# Patient Record
Sex: Female | Born: 1970 | Race: White | Hispanic: No | Smoking: Former smoker
Health system: Southern US, Community
[De-identification: ages and names within clinical notes are randomized; demographics above are authoritative.]

## PROBLEM LIST (undated history)

## (undated) DIAGNOSIS — G4730 Sleep apnea, unspecified: Secondary | ICD-10-CM

## (undated) DIAGNOSIS — E079 Disorder of thyroid, unspecified: Secondary | ICD-10-CM

## (undated) DIAGNOSIS — K5792 Diverticulitis of intestine, part unspecified, without perforation or abscess without bleeding: Secondary | ICD-10-CM

## (undated) DIAGNOSIS — E119 Type 2 diabetes mellitus without complications: Secondary | ICD-10-CM

## (undated) DIAGNOSIS — B019 Varicella without complication: Secondary | ICD-10-CM

## (undated) DIAGNOSIS — K219 Gastro-esophageal reflux disease without esophagitis: Secondary | ICD-10-CM

## (undated) DIAGNOSIS — F329 Major depressive disorder, single episode, unspecified: Secondary | ICD-10-CM

## (undated) DIAGNOSIS — I10 Essential (primary) hypertension: Secondary | ICD-10-CM

## (undated) DIAGNOSIS — E785 Hyperlipidemia, unspecified: Secondary | ICD-10-CM

## (undated) DIAGNOSIS — F32A Depression, unspecified: Secondary | ICD-10-CM

## (undated) HISTORY — DX: Essential (primary) hypertension: I10

## (undated) HISTORY — PX: TUBAL LIGATION: SHX77

## (undated) HISTORY — DX: Hyperlipidemia, unspecified: E78.5

## (undated) HISTORY — DX: Depression, unspecified: F32.A

## (undated) HISTORY — DX: Gastro-esophageal reflux disease without esophagitis: K21.9

## (undated) HISTORY — PX: ANTERIOR CERVICAL DECOMP/DISCECTOMY FUSION: SHX1161

## (undated) HISTORY — PX: 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 REPAIR: SHX6512

## (undated) HISTORY — DX: Major depressive disorder, single episode, unspecified: F32.9

## (undated) HISTORY — DX: Disorder of thyroid, unspecified: E07.9

## (undated) HISTORY — PX: NASAL SINUS SURGERY: SHX719

## (undated) HISTORY — DX: Varicella without complication: B01.9

## (undated) HISTORY — PX: FOOT SURGERY: SHX648

## (undated) HISTORY — DX: Type 2 diabetes mellitus without complications: E11.9

---

## 2005-12-09 ENCOUNTER — Encounter: Payer: Self-pay | Admitting: Internal Medicine

## 2005-12-22 ENCOUNTER — Ambulatory Visit: Payer: Self-pay | Admitting: Otolaryngology

## 2006-11-15 ENCOUNTER — Ambulatory Visit: Payer: Self-pay

## 2006-11-16 ENCOUNTER — Encounter: Payer: Self-pay | Admitting: Physician Assistant

## 2006-11-23 ENCOUNTER — Encounter: Payer: Self-pay | Admitting: Physician Assistant

## 2007-05-07 ENCOUNTER — Ambulatory Visit: Payer: Self-pay | Admitting: Internal Medicine

## 2008-02-08 ENCOUNTER — Ambulatory Visit: Payer: Self-pay | Admitting: Unknown Physician Specialty

## 2008-02-22 ENCOUNTER — Ambulatory Visit: Payer: Self-pay | Admitting: Unknown Physician Specialty

## 2008-03-24 ENCOUNTER — Ambulatory Visit: Payer: Self-pay | Admitting: Unknown Physician Specialty

## 2008-04-24 ENCOUNTER — Ambulatory Visit: Payer: Self-pay | Admitting: Unknown Physician Specialty

## 2008-07-09 ENCOUNTER — Ambulatory Visit: Payer: Self-pay

## 2009-01-26 ENCOUNTER — Ambulatory Visit: Payer: Self-pay | Admitting: Internal Medicine

## 2009-01-27 ENCOUNTER — Emergency Department: Payer: Self-pay | Admitting: Emergency Medicine

## 2009-05-03 ENCOUNTER — Ambulatory Visit: Payer: Self-pay | Admitting: Podiatry

## 2009-05-10 ENCOUNTER — Ambulatory Visit: Payer: Self-pay | Admitting: Podiatry

## 2009-11-19 ENCOUNTER — Ambulatory Visit: Payer: Self-pay

## 2009-12-28 ENCOUNTER — Ambulatory Visit: Payer: Self-pay | Admitting: Neurosurgery

## 2010-06-14 ENCOUNTER — Emergency Department: Payer: Self-pay | Admitting: Emergency Medicine

## 2010-08-08 ENCOUNTER — Ambulatory Visit: Payer: Self-pay | Admitting: Gastroenterology

## 2010-08-11 LAB — PATHOLOGY REPORT

## 2010-08-20 ENCOUNTER — Ambulatory Visit: Payer: Self-pay | Admitting: Gastroenterology

## 2010-09-17 ENCOUNTER — Ambulatory Visit: Payer: Self-pay | Admitting: Gastroenterology

## 2010-10-28 DIAGNOSIS — E1065 Type 1 diabetes mellitus with hyperglycemia: Secondary | ICD-10-CM | POA: Insufficient documentation

## 2011-02-19 ENCOUNTER — Ambulatory Visit: Payer: Self-pay | Admitting: General Practice

## 2011-04-03 DIAGNOSIS — I10 Essential (primary) hypertension: Secondary | ICD-10-CM | POA: Insufficient documentation

## 2011-04-03 DIAGNOSIS — G4733 Obstructive sleep apnea (adult) (pediatric): Secondary | ICD-10-CM | POA: Insufficient documentation

## 2011-04-03 DIAGNOSIS — F32A Depression, unspecified: Secondary | ICD-10-CM | POA: Insufficient documentation

## 2011-04-03 DIAGNOSIS — E785 Hyperlipidemia, unspecified: Secondary | ICD-10-CM | POA: Insufficient documentation

## 2011-04-03 DIAGNOSIS — K219 Gastro-esophageal reflux disease without esophagitis: Secondary | ICD-10-CM | POA: Insufficient documentation

## 2011-04-03 DIAGNOSIS — E039 Hypothyroidism, unspecified: Secondary | ICD-10-CM | POA: Insufficient documentation

## 2011-08-25 HISTORY — PX: ROTATOR CUFF REPAIR: SHX139

## 2012-03-25 ENCOUNTER — Ambulatory Visit: Payer: Self-pay | Admitting: General Practice

## 2012-06-01 ENCOUNTER — Ambulatory Visit: Payer: Self-pay | Admitting: General Practice

## 2012-08-24 HISTORY — PX: CERVICAL DISC ARTHROPLASTY: SHX587

## 2012-08-24 HISTORY — PX: CHOLECYSTECTOMY: SHX55

## 2013-01-30 ENCOUNTER — Ambulatory Visit: Payer: Self-pay | Admitting: Neurosurgery

## 2013-02-14 ENCOUNTER — Other Ambulatory Visit: Payer: Self-pay | Admitting: Neurosurgery

## 2013-03-30 ENCOUNTER — Ambulatory Visit: Payer: Self-pay | Admitting: Specialist

## 2013-04-04 ENCOUNTER — Ambulatory Visit: Admit: 2013-04-04 | Payer: Self-pay | Admitting: Neurosurgery

## 2013-04-04 SURGERY — CERVICAL ANTERIOR DISC ARTHROPLASTY
Anesthesia: General

## 2013-06-13 DIAGNOSIS — E282 Polycystic ovarian syndrome: Secondary | ICD-10-CM | POA: Insufficient documentation

## 2013-08-10 ENCOUNTER — Ambulatory Visit: Payer: Self-pay

## 2013-12-03 ENCOUNTER — Ambulatory Visit: Payer: Self-pay

## 2013-12-03 LAB — URINALYSIS, COMPLETE
BLOOD: NEGATIVE
Bilirubin,UR: NEGATIVE
GLUCOSE, UR: NEGATIVE mg/dL (ref 0–75)
KETONE: NEGATIVE
Leukocyte Esterase: NEGATIVE
NITRITE: NEGATIVE
PH: 8.5 (ref 4.5–8.0)
Specific Gravity: 1.005 (ref 1.003–1.030)

## 2013-12-03 LAB — CBC WITH DIFFERENTIAL/PLATELET
BASOS ABS: 0 10*3/uL (ref 0.0–0.1)
Basophil %: 0.2 %
EOS PCT: 1 %
Eosinophil #: 0.1 10*3/uL (ref 0.0–0.7)
HCT: 42.2 % (ref 35.0–47.0)
HGB: 14.1 g/dL (ref 12.0–16.0)
Lymphocyte #: 0.3 10*3/uL — ABNORMAL LOW (ref 1.0–3.6)
Lymphocyte %: 4.5 %
MCH: 30 pg (ref 26.0–34.0)
MCHC: 33.4 g/dL (ref 32.0–36.0)
MCV: 90 fL (ref 80–100)
Monocyte #: 0.3 x10 3/mm (ref 0.2–0.9)
Monocyte %: 5.1 %
Neutrophil #: 6.1 10*3/uL (ref 1.4–6.5)
Neutrophil %: 89.2 %
PLATELETS: 193 10*3/uL (ref 150–440)
RBC: 4.69 10*6/uL (ref 3.80–5.20)
RDW: 12.9 % (ref 11.5–14.5)
WBC: 6.8 10*3/uL (ref 3.6–11.0)

## 2013-12-03 LAB — COMPREHENSIVE METABOLIC PANEL
ALT: 37 U/L (ref 12–78)
AST: 33 U/L (ref 15–37)
Albumin: 3.2 g/dL — ABNORMAL LOW (ref 3.4–5.0)
Alkaline Phosphatase: 98 U/L
Anion Gap: 8 (ref 7–16)
BUN: 8 mg/dL (ref 7–18)
Bilirubin,Total: 1.2 mg/dL — ABNORMAL HIGH (ref 0.2–1.0)
CALCIUM: 8.5 mg/dL (ref 8.5–10.1)
CHLORIDE: 102 mmol/L (ref 98–107)
Co2: 30 mmol/L (ref 21–32)
Creatinine: 0.72 mg/dL (ref 0.60–1.30)
EGFR (Non-African Amer.): >60
GLUCOSE: 90 mg/dL (ref 65–99)
Osmolality: 277 (ref 275–301)
Potassium: 4.2 mmol/L (ref 3.5–5.1)
Sodium: 140 mmol/L (ref 136–145)
Total Protein: 7 g/dL (ref 6.4–8.2)

## 2013-12-03 LAB — PREGNANCY, URINE: Pregnancy Test, Urine: NEGATIVE m[IU]/mL

## 2013-12-03 LAB — LIPASE, BLOOD: LIPASE: 62 U/L — AB (ref 73–393)

## 2013-12-03 LAB — AMYLASE: Amylase: 30 U/L (ref 25–115)

## 2013-12-05 ENCOUNTER — Emergency Department: Payer: Self-pay | Admitting: Emergency Medicine

## 2013-12-05 DIAGNOSIS — E7800 Pure hypercholesterolemia, unspecified: Secondary | ICD-10-CM | POA: Insufficient documentation

## 2013-12-05 LAB — COMPREHENSIVE METABOLIC PANEL
Albumin: 3 g/dL — ABNORMAL LOW (ref 3.4–5.0)
Alkaline Phosphatase: 89 U/L
Anion Gap: 6 — ABNORMAL LOW (ref 7–16)
BUN: 6 mg/dL — ABNORMAL LOW (ref 7–18)
Bilirubin,Total: 0.7 mg/dL (ref 0.2–1.0)
CALCIUM: 8.5 mg/dL (ref 8.5–10.1)
CHLORIDE: 104 mmol/L (ref 98–107)
Co2: 27 mmol/L (ref 21–32)
Creatinine: 0.77 mg/dL (ref 0.60–1.30)
EGFR (African American): >60
EGFR (Non-African Amer.): >60
Glucose: 162 mg/dL — ABNORMAL HIGH (ref 65–99)
Osmolality: 275 (ref 275–301)
Potassium: 3.8 mmol/L (ref 3.5–5.1)
SGOT(AST): 27 U/L (ref 15–37)
SGPT (ALT): 29 U/L (ref 12–78)
SODIUM: 137 mmol/L (ref 136–145)
Total Protein: 7.1 g/dL (ref 6.4–8.2)

## 2013-12-05 LAB — URINALYSIS, COMPLETE
BILIRUBIN, UR: NEGATIVE
GLUCOSE, UR: NEGATIVE mg/dL (ref 0–75)
Leukocyte Esterase: NEGATIVE
Nitrite: NEGATIVE
PH: 6 (ref 4.5–8.0)
RBC,UR: NONE SEEN /HPF (ref 0–5)
SPECIFIC GRAVITY: 1.01 (ref 1.003–1.030)
WBC UR: NONE SEEN /HPF (ref 0–5)

## 2013-12-05 LAB — CBC WITH DIFFERENTIAL/PLATELET
Basophil #: 0 10*3/uL (ref 0.0–0.1)
Basophil %: 0.6 %
Eosinophil #: 0 10*3/uL (ref 0.0–0.7)
Eosinophil %: 0.4 %
HCT: 43.7 % (ref 35.0–47.0)
HGB: 14.1 g/dL (ref 12.0–16.0)
LYMPHS PCT: 24.6 %
Lymphocyte #: 1.5 10*3/uL (ref 1.0–3.6)
MCH: 28.9 pg (ref 26.0–34.0)
MCHC: 32.3 g/dL (ref 32.0–36.0)
MCV: 90 fL (ref 80–100)
Monocyte #: 1 x10 3/mm — ABNORMAL HIGH (ref 0.2–0.9)
Monocyte %: 16.1 %
Neutrophil #: 3.6 10*3/uL (ref 1.4–6.5)
Neutrophil %: 58.3 %
PLATELETS: 189 10*3/uL (ref 150–440)
RBC: 4.89 10*6/uL (ref 3.80–5.20)
RDW: 12.9 % (ref 11.5–14.5)
WBC: 6.2 10*3/uL (ref 3.6–11.0)

## 2013-12-05 LAB — TROPONIN I

## 2013-12-05 LAB — LIPASE, BLOOD: LIPASE: 124 U/L (ref 73–393)

## 2014-02-01 ENCOUNTER — Ambulatory Visit: Payer: Self-pay | Admitting: Neurology

## 2014-03-26 DIAGNOSIS — F988 Other specified behavioral and emotional disorders with onset usually occurring in childhood and adolescence: Secondary | ICD-10-CM | POA: Insufficient documentation

## 2014-05-26 ENCOUNTER — Ambulatory Visit: Payer: Self-pay | Admitting: General Practice

## 2014-06-06 ENCOUNTER — Encounter: Payer: Self-pay | Admitting: Family Medicine

## 2014-06-06 ENCOUNTER — Ambulatory Visit (INDEPENDENT_AMBULATORY_CARE_PROVIDER_SITE_OTHER): Payer: No Typology Code available for payment source | Admitting: Family Medicine

## 2014-06-06 VITALS — BP 114/72 | HR 77 | Ht 68.0 in | Wt 248.0 lb

## 2014-06-06 DIAGNOSIS — M5416 Radiculopathy, lumbar region: Secondary | ICD-10-CM | POA: Insufficient documentation

## 2014-06-06 MED ORDER — MELOXICAM 15 MG PO TABS: 15.0000 mg | ORAL_TABLET | Freq: Every day | ORAL | Status: DC

## 2014-06-06 NOTE — Progress Notes (Signed)
[name] D.O. [facility] [address] [place], [place] [address] Phone: [phone](336) [phone] Subjective:    Referred by Catha BrowMonique Deacon, Durward Parcelon Seeley [place], PA of [facility].   CC: Low back pain with radiculopathy.  NFA:[id number]:Subjective Brandy Bryant is a 43 y.o. female coming in with complaint of low back pain with radiculopathy. 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 Patient states for the last 2 weeks she started having intense back pain was on the low side on the right side. Patient states that it has improved slowly over the course last 2 weeks but continues to give her difficulty with even activities of daily living. Patient states that this is accompanied with radiation down the posterior aspect of her leg I can shoot down past her knee but did not notice any involvement in her foot. Denies any weakness or numbness but states that she moves wrong she can have a severe pain that can wake her up at night. The severity of 7/10. Patient was given some pain medications which mild improvement. Patient's x-rays from outside hospital shows some degenerative disc disease. Patient was also sent for an MRI which was reviewed by me. Patient does have mild to moderate bulging of a L5-S1 disc but no true nerve root impingement. Patient would like to be more active but is concerned because what seemed to exacerbate the situation was when she started running on a more regular basis.     Past medical history, social, surgical and family history all reviewed in electronic medical record.   Review of Systems: No headache, visual changes, nausea, vomiting, diarrhea, constipation, dizziness, abdominal pain, skin rash, fevers, chills, night sweats, weight loss, swollen lymph nodes, body aches, joint swelling, muscle aches, chest pain, shortness of breath, mood changes.    Objective Blood pressure 114/72, pulse 77, height 5\' 8"  (1.727 m), weight 248 lb (112.492 kg), SpO2 98.00%.  General: No apparent distress alert and oriented x3 mood and affect normal, dressed appropriately.  HEENT: Pupils equal, extraocular movements intact  Respiratory: Patient's speak in full sentences and does not appear short of breath  Cardiovascular: No lower extremity edema, non tender, no erythema  Skin: Warm dry intact with no signs of infection or rash on extremities or on axial skeleton.  Abdomen: Soft nontender  Neuro: Cranial nerves II through XII are intact, neurovascularly intact in all extremities with 2+ DTRs and 2+ pulses.  Lymph: No lymphadenopathy of posterior or anterior cervical chain or axillae bilaterally.  Gait normal with good balance and coordination.  MSK:  Non tender with full range of motion and good stability and symmetric strength and tone of shoulders, elbows, wrist, hip, knee and ankles bilaterally.  Back Exam:  Inspection: Unremarkable  Motion: Flexion 35 deg, Extension 25 deg with pain, Side Bending to 35 deg bilaterally,  Rotation to 35 deg bilaterally  SLR laying: Positive right XSLR laying: Positive right Palpable tenderness: Severely tender over the L5-S1 paraspinal musculature on the right side. FABER: negative. Sensory change: Gross sensation intact to all lumbar and sacral dermatomes.  Reflexes: 2+ at both patellar tendons, 2+ at achilles tendons, Babinski's downgoing.  Strength at foot  Plantar-flexion: 5/5 Dorsi-flexion: 5/5 Eversion: 5/5 Inversion: 5/5  Leg strength  Quad: 5/5 Hamstring: 5/5 Hip flexor: 5/5 Hip abductors: 4/5  Gait unremarkable.     Impression and Recommendations:  This case required medical decision making of moderate complexity.

## 2014-06-06 NOTE — Assessment & Plan Note (Signed)
Patient does have a bulging disc on MRI. Patient's physical exam today does show potential for actually nerve impingement. Patient does have a positive straight leg test but no weakness noted on exam and neurovascularly intact distally. I do think the patient will likely do very well with conservative therapy. Patient was given a ten-day course of anti-inflammatories, icing protocol, home exercises, as well as over-the-counter medications that can be beneficial. We discussed the possibility of formal physical therapy but patient declined at this time. She will try these interventions and come back again in 2 weeks for further evaluation. Continuing to have difficulty I would encourage formal physical therapy at that time. I would also consider starting another medication such as gabapentin and significant improvement. Differential also includes piriformis syndrome with radicular symptoms at this time then patient's history and more concerned impingement of the nerve higher. Patient also had negative Pearlean BrownieFaber test today. We will check in again with patient in 2 weeks.

## 2014-06-06 NOTE — Patient Instructions (Signed)
Very nice to meet you I do believe you have a small herniated disc Ice 20 minutes 2 times daily. Usually after activity and before bed. Exercises 3 times a week.  Vitamin D 2000 IU daily Turmeric 500mg  twice daily.  Meloixcam daily for 10 days then as needed See me again in 2 weeks

## 2014-06-21 ENCOUNTER — Ambulatory Visit (INDEPENDENT_AMBULATORY_CARE_PROVIDER_SITE_OTHER): Payer: No Typology Code available for payment source | Admitting: Family Medicine

## 2014-06-21 ENCOUNTER — Encounter: Payer: Self-pay | Admitting: Family Medicine

## 2014-06-21 VITALS — BP 120/66 | HR 97 | Ht 68.0 in | Wt 252.0 lb

## 2014-06-21 DIAGNOSIS — M5416 Radiculopathy, lumbar region: Secondary | ICD-10-CM

## 2014-06-21 NOTE — Assessment & Plan Note (Signed)
Patient is significantly improved with conservative therapy. Encourage patient to continue the exercises on a regular basis. Patient will start to decrease the amount of anti-inflammatory she is using on a regular basis. We discussed continuing the over-the-counter medications. Patient was given other strengthening exercises and showed proper technique today. Patient and will show up again in 3-4 weeks for further evaluation and treatment.  Spent greater than 25 minutes with patient face-to-face and had greater than 50% of counseling including as described above in assessment and plan.

## 2014-06-21 NOTE — Patient Instructions (Signed)
Good to see you.  Ice is still your friend Edwinna AreolaOCntinue the 5 exercises Then... Exercises on wall.  Heel and butt touching.  Raise leg 6 inches and hold 2 seconds.  Down slow for count of 4 seconds.  1 set of 30 reps daily on both sides.  Continue the vitamin D forever.  Meloxicam go down to 3 times a week for 2 weeks then only as needed See me again in 3-4 weeks if still in pain

## 2014-06-21 NOTE — Progress Notes (Signed)
  Tawana ScaleZach Smith D.O. [facility] [address] [place], [place] [address] Phone: [phone]([phone] Subjective:     CC: Low back pain with radiculopathy. Follow up  VOZ:[id number]:Subjective Brandy Bryant is a 43 y.o. female coming in with complaint of low back pain with radiculopathy.  Patient was seen no for low back pain. There was a concern for patient having more of a lumbar radiculopathy. Patient was treated conservatively with anti-inflammatories, over-the-counter medicines, as well as a home exercise program. Patient states that she is approximately 85-90% better. Patient states that she is no longer having the radicular symptoms going down her leg and denies any weakness or numbness. Patient still has some mild discomfort over the right SI joint with certain movements or palpation but overall is doing significantly better. Denies any nighttime awakening. Denies any new symptoms.   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   Past imaging-MRI  does have mild to moderate bulging of a L5-S1 disc but no true nerve root impingement. Patient would like to be more active but is concerned because what seemed to exacerbate the situation was when she started running on a more regular basis.     Past medical history, social, surgical and family history all reviewed in electronic medical record.   Review of Systems: No headache, visual changes, nausea, vomiting, diarrhea, constipation, dizziness, abdominal pain, skin rash, fevers, chills, night sweats, weight loss, swollen lymph nodes, body aches, joint swelling, muscle aches, chest pain, shortness of breath, mood changes.   Objective Blood pressure 120/66, pulse 97, height 5\' 8"  (1.727 m), weight 252 lb (114.306 kg), SpO2 97.00%.  General: No apparent distress alert and oriented x3 mood and affect normal, dressed appropriately.    HEENT: Pupils equal, extraocular movements intact  Respiratory: Patient's speak in full sentences and does not appear short of breath  Cardiovascular: No lower extremity edema, non tender, no erythema  Skin: Warm dry intact with no signs of infection or rash on extremities or on axial skeleton.  Abdomen: Soft nontender  Neuro: Cranial nerves II through XII are intact, neurovascularly intact in all extremities with 2+ DTRs and 2+ pulses.  Lymph: No lymphadenopathy of posterior or anterior cervical chain or axillae bilaterally.  Gait normal with good balance and coordination.  MSK:  Non tender with full range of motion and good stability and symmetric strength and tone of shoulders, elbows, wrist, hip, knee and ankles bilaterally.  Back Exam:  Inspection: Unremarkable  Motion: Flexion 35 deg, Extension 30 deg with pain, Side Bending to 35 deg bilaterally,  Rotation to 35 deg bilaterally  SLR laying: Negative XSLR laying: Negative Palpable tenderness: Continued tenderness over the L5-S1 area on the right side moderately improved from previous exam FABER: negative. Sensory change: Gross sensation intact to all lumbar and sacral dermatomes.  Reflexes: 2+ at both patellar tendons, 2+ at achilles tendons, Babinski's downgoing.  Strength at foot  Plantar-flexion: 5/5 Dorsi-flexion: 5/5 Eversion: 5/5 Inversion: 5/5  Leg strength  Quad: 5/5 Hamstring: 5/5 Hip flexor: 5/5 Hip abductors: 4/5  Gait unremarkable.     Impression and Recommendations:     This case required medical decision making of moderate complexity.

## 2014-07-16 ENCOUNTER — Ambulatory Visit (INDEPENDENT_AMBULATORY_CARE_PROVIDER_SITE_OTHER): Payer: No Typology Code available for payment source | Admitting: Family Medicine

## 2014-07-16 ENCOUNTER — Encounter: Payer: Self-pay | Admitting: Family Medicine

## 2014-07-16 VITALS — BP 124/70 | HR 85 | Ht 68.0 in | Wt 259.0 lb

## 2014-07-16 DIAGNOSIS — M5416 Radiculopathy, lumbar region: Secondary | ICD-10-CM

## 2014-07-16 MED ORDER — MELOXICAM 15 MG PO TABS: 15.0000 mg | ORAL_TABLET | Freq: Every day | ORAL | Status: DC

## 2014-07-16 NOTE — Progress Notes (Signed)
  Tawana ScaleZach Smith D.O. [facility] [address] [place], [place] [address] Phone: [phone]([phone] Subjective:     CC: Low back pain with radiculopathy. Follow up  NFA:OZHYQMVHQIHPI:Subjective Doran StablerJulia M Bryant is a 43 y.o. female coming in with complaint of low back pain with radiculopathy.  Patient was seen no for low back pain. There was a concern for patient having more of a lumbar radiculopathy. Patient was treated conservatively with anti-inflammatories, over-the-counter medicines, as well as a home exercise program. Patient at last visit was approximately 90% better. This was one month ago. Patient states that she does no longer having any pain going down the leg but unfortunately continues to have a dull aching pain in the lower back. This is not stopping her from any her activities and able to do daily activities without it becoming difficult. Patient though states sitting a long amount of time or even getting comfortable at night is difficult. Denies any nighttime awakening. Denies any fevers or chills or any abnormal weight loss.   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    Pertinent imaging 05/26/2014 Past imaging-MRI  does have mild to moderate bulging of a L5-S1 disc but no true nerve root impingement.      Past medical history, social, surgical and family history all reviewed in electronic medical record.   Review of Systems: No headache, visual changes, nausea, vomiting, diarrhea, constipation, dizziness, abdominal pain, skin rash, fevers, chills, night sweats, weight loss, swollen lymph nodes, body aches, joint swelling, muscle aches, chest pain, shortness of breath, mood changes.   Objective Blood pressure 124/70, pulse 85, height 5\' 8"  (1.727 m), weight 259 lb (117.482 kg), SpO2 98 %.  General: No apparent distress alert and  oriented x3 mood and affect normal, dressed appropriately.  HEENT: Pupils equal, extraocular movements intact  Respiratory: Patient's speak in full sentences and does not appear short of breath  Cardiovascular: No lower extremity edema, non tender, no erythema  Skin: Warm dry intact with no signs of infection or rash on extremities or on axial skeleton.  Abdomen: Soft nontender  Neuro: Cranial nerves II through XII are intact, neurovascularly intact in all extremities with 2+ DTRs and 2+ pulses.  Lymph: No lymphadenopathy of posterior or anterior cervical chain or axillae bilaterally.  Gait normal with good balance and coordination.  MSK:  Non tender with full range of motion and good stability and symmetric strength and tone of shoulders, elbows, wrist, hip, knee and ankles bilaterally.  Back Exam:  Inspection: Unremarkable  Motion: Flexion 35 deg, Extension 30 deg with pain, Side Bending to 35 deg bilaterally,  Rotation to 35 deg bilaterally  SLR laying: Negative XSLR laying: Negative Palpable tenderness: Minimal tenderness continued at L5-S1 FABER: negative. Sensory change: Gross sensation intact to all lumbar and sacral dermatomes.  Reflexes: 2+ at both patellar tendons, 2+ at achilles tendons, Babinski's downgoing.  Strength at foot  Plantar-flexion: 5/5 Dorsi-flexion: 5/5 Eversion: 5/5 Inversion: 5/5  Leg strength  Quad: 5/5 Hamstring: 5/5 Hip flexor: 5/5 Hip abductors: 4/5  Gait unremarkable.   Impression and Recommendations:     This case required medical decision making of moderate complexity.

## 2014-07-16 NOTE — Assessment & Plan Note (Signed)
Radicular symptoms seem to have resolved at this time the patient still has a dull 19 lower back pain. Encourage her to do the exercises on a regular basis. Patient declined formal physical therapy. Patient is a type I diabetic and we did discuss the possible concerns of anti-inflammatories but this seems to be the only thing that patient has responded to. We decided on meloxicam but using him in short bursts inside of a daily regimen. Patient was given a 90 day supply today. We discussed proper shoewear as well as postural exercises. Patient showed proper technique multiple different exercises. Patient then is to follow-up again in 4-6 weeks for further evaluation.  Spent greater than 25 minutes with patient face-to-face and had greater than 50% of counseling including as described above in assessment and plan.

## 2014-07-16 NOTE — Patient Instructions (Addendum)
Good to see you Ice is your friend Conitnue the exercises 3 times a week.  Meloxicam daily for 1 week with flares then every other day for 2 weeks then as needed. See me again in 4 weeks or sooner if anything changes Happy Holidays!

## 2014-08-01 ENCOUNTER — Ambulatory Visit (INDEPENDENT_AMBULATORY_CARE_PROVIDER_SITE_OTHER): Payer: No Typology Code available for payment source | Admitting: Family Medicine

## 2014-08-01 ENCOUNTER — Encounter: Payer: Self-pay | Admitting: Family Medicine

## 2014-08-01 VITALS — BP 108/64 | HR 90 | Ht 68.0 in | Wt 256.0 lb

## 2014-08-01 DIAGNOSIS — F329 Major depressive disorder, single episode, unspecified: Secondary | ICD-10-CM | POA: Insufficient documentation

## 2014-08-01 DIAGNOSIS — I10 Essential (primary) hypertension: Secondary | ICD-10-CM | POA: Insufficient documentation

## 2014-08-01 DIAGNOSIS — F32A Depression, unspecified: Secondary | ICD-10-CM | POA: Insufficient documentation

## 2014-08-01 DIAGNOSIS — M5416 Radiculopathy, lumbar region: Secondary | ICD-10-CM

## 2014-08-01 MED ORDER — GABAPENTIN 100 MG PO CAPS: 200.0000 mg | ORAL_CAPSULE | Freq: Every day | ORAL | Status: DC

## 2014-08-01 NOTE — Progress Notes (Signed)
  Tawana ScaleZach Rina Adney D.O.  [facility] [address] [place], [place] [address] Phone: [phone] Subjective:     CC: Low back pain with radiculopathy. Follow up with exacerbation  MVH:QIONGEXBMWHPI:Subjective Doran StablerJulia M Bentler is a 43 y.o. female coming in with complaint of low back pain with radiculopathy.  Patient was seen no for low back pain. There was a concern for patient having more of a lumbar radiculopathy. Patient was treated conservatively with anti-inflammatories, over-the-counter medicines, as well as a home exercise program. Patient was doing significantly better. Unfortunately patient did play drums for multiple hours on and without any back support and is having an exacerbation. Seems to be still on the right side. Mild radiation into her right buttocks. No pain down the leg. No weakness. Not responding to the anti-inflammatories she's had previously. Patient has began to not do the exercises on a regular basis.   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    Pertinent imaging 05/26/2014 Past imaging-MRI  does have mild to moderate bulging of a L5-S1 disc but no true nerve root impingement.      Past medical history, social, surgical and family history all reviewed in electronic medical record.   Review of Systems: No headache, visual changes, nausea, vomiting, diarrhea, constipation, dizziness, abdominal pain, skin rash, fevers, chills, night sweats, weight loss, swollen lymph nodes, body aches, joint swelling, muscle aches, chest pain, shortness of breath, mood changes.   Objective Blood pressure 108/64, pulse 90, height 5\' 8"  (1.727 m), weight 256 lb (116.121 kg), SpO2 98 %.  General: No apparent distress alert and oriented x3 mood and affect normal, dressed appropriately.  HEENT: Pupils equal, extraocular movements intact    Respiratory: Patient's speak in full sentences and does not appear short of breath  Cardiovascular: No lower extremity edema, non tender, no erythema  Skin: Warm dry intact with no signs of infection or rash on extremities or on axial skeleton.  Abdomen: Soft nontender  Neuro: Cranial nerves II through XII are intact, neurovascularly intact in all extremities with 2+ DTRs and 2+ pulses.  Lymph: No lymphadenopathy of posterior or anterior cervical chain or axillae bilaterally.  Gait normal with good balance and coordination.  MSK:  Non tender with full range of motion and good stability and symmetric strength and tone of shoulders, elbows, wrist, hip, knee and ankles bilaterally.  Back Exam:  Inspection: Unremarkable  Motion: Flexion 35 deg, Extension 30 deg with pain right side, Side Bending to 35 deg bilaterally,  Rotation to 35 deg bilaterally  SLR laying: Positive right XSLR laying: Negative Palpable tenderness: Minimal tenderness continued at L5-S1 FABER: Positive right Sensory change: Gross sensation intact to all lumbar and sacral dermatomes.  Reflexes: 2+ at both patellar tendons, 2+ at achilles tendons, Babinski's downgoing.  Strength at foot  Plantar-flexion: 5/5 Dorsi-flexion: 5/5 Eversion: 5/5 Inversion: 5/5  Leg strength  Quad: 5/5 Hamstring: 5/5 Hip flexor: 5/5 Hip abductors: 4/5  Gait unremarkable.   Impression and Recommendations:     This case required medical decision making of moderate complexity.

## 2014-08-01 NOTE — Assessment & Plan Note (Signed)
Patient is having an exacerbation. Patient did have change of her anti-inflammatory. We discussed icing protocol and home exercises. Patient will avoid certain activities over the course of next 10 days. We discussed if she does not make any significant improvement in the next 2 day she will call and we will do a prednisone burst. Patient was also given gabapentin to see if this will help with some of the pain. We discussed possibility of formal physical therapy which patient declined. Patient and will follow-up with me again in the next 10-14 days for further evaluation and treatment.  Spent greater than 25 minutes with patient face-to-face and had greater than 50% of counseling including as described above in assessment and plan.

## 2014-08-01 NOTE — Patient Instructions (Signed)
Good to see you Ice 20 minutes 2 times daily. Usually after activity and before bed. Exercises 3 times a week.   Try gabapentin 100mg  at night for 3 nights and then 200mg  nightly New medicine 3 timeds daily for next 9 days  Stop the meloxicam See me again if not perfeect.

## 2014-08-23 ENCOUNTER — Ambulatory Visit: Payer: Self-pay

## 2014-08-23 ENCOUNTER — Ambulatory Visit: Payer: No Typology Code available for payment source | Admitting: Family Medicine

## 2014-09-03 ENCOUNTER — Encounter: Payer: Self-pay | Admitting: Family Medicine

## 2014-09-04 MED ORDER — TRAMADOL HCL 50 MG PO TABS: 50.0000 mg | ORAL_TABLET | Freq: Three times a day (TID) | ORAL | Status: DC | PRN

## 2014-09-04 NOTE — Telephone Encounter (Signed)
Refill done.  

## 2014-09-05 ENCOUNTER — Ambulatory Visit: Payer: Self-pay

## 2014-09-10 ENCOUNTER — Ambulatory Visit (INDEPENDENT_AMBULATORY_CARE_PROVIDER_SITE_OTHER): Payer: No Typology Code available for payment source | Admitting: Family Medicine

## 2014-09-10 ENCOUNTER — Encounter: Payer: Self-pay | Admitting: Family Medicine

## 2014-09-10 VITALS — BP 126/78 | HR 83 | Ht 68.0 in | Wt 255.0 lb

## 2014-09-10 DIAGNOSIS — M999 Biomechanical lesion, unspecified: Secondary | ICD-10-CM | POA: Insufficient documentation

## 2014-09-10 DIAGNOSIS — M5416 Radiculopathy, lumbar region: Secondary | ICD-10-CM

## 2014-09-10 DIAGNOSIS — M9904 Segmental and somatic dysfunction of sacral region: Secondary | ICD-10-CM

## 2014-09-10 DIAGNOSIS — M9902 Segmental and somatic dysfunction of thoracic region: Secondary | ICD-10-CM

## 2014-09-10 DIAGNOSIS — M9903 Segmental and somatic dysfunction of lumbar region: Secondary | ICD-10-CM

## 2014-09-10 MED ORDER — DICLOFENAC SODIUM 2 % TD SOLN: TRANSDERMAL | Status: DC

## 2014-09-10 NOTE — Assessment & Plan Note (Signed)
Decision today to treat with OMT was based on Physical Exam  After verbal consent patient was treated with HVLA, ME, techniques in cervical, thoracic, lumbar and sacral areas  Patient tolerated the procedure well with improvement in symptoms  Patient given exercises, stretches and lifestyle modifications  See medications in patient instructions if given  Patient will follow up in 3 weeks

## 2014-09-10 NOTE — Assessment & Plan Note (Signed)
Patient has had a bulging disc previously but it seemed to be on the right side. Do think that this is contributing to most of her pain. We discussed different treatment options. We discussed oral prednisone but due to patient's diabetes she has had elevated blood sugars previously. Patient elected to have osteopathic manipulation and did respond fairly well. We discussed the icing protocol as well as anti-inflammatories. Patient does not make improvement we can consider a right sacroiliac joint injection. Patient if the pain goes down her leg more recently we will consider an epidural steroid injection. Patient will call with worsening pain. Patient was given an exercise prescription to start increasing again. Patient will come back again in 2-3 weeks for further evaluation and treatment.

## 2014-09-10 NOTE — Patient Instructions (Signed)
Good to see you Ice 20 minutes 2 times daily. Usually after activity and before bed. Continue the exercises 3 times weekly Continue the gabapentin Call me in 2-3 days if not better and we will try epidural injection.  Otherwise see me in 2-3 weeks for another adjustment.

## 2014-09-10 NOTE — Progress Notes (Signed)
Brandy Bryant D.O. [facility] [address] [place], [place] [address] Phone: [phone] Subjective:     CC: Low back pain with radiculopathy. Follow up with exacerbation  BJY:NWGNFAOZHYHPI:Subjective Brandy Bryant is a 44 y.o. female coming in with complaint of low back pain with radiculopathy.  Patient was seen no for low back pain. There was a concern for patient having more of a lumbar radiculopathy. Patient was treated conservatively with anti-inflammatories, over-the-counter medicines, as well as a home exercise program.   Patient is having worsening pain. Patient states that the pain is going down the right leg. Patient states that she denies any numbness consistently but does happen intermittently. Patient states that there is no weakness in his leg. Patient though states that on Sunday she was unable to do any activity. Today has gotten somewhat better. Patient has taken the over-the-counter medications as well as tramadol fairly regularly. Patient continues with the gabapentin 200 mg at night. Patient though is concerned.  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    Pertinent imaging 05/26/2014 Past imaging-MRI  does have mild to moderate bulging of a L5-S1 disc but no true nerve root impingement.      Past medical history, social, surgical and family history all reviewed in electronic medical record.   Review of Systems: No headache, visual changes, nausea, vomiting, diarrhea, constipation, dizziness, abdominal pain, skin rash, fevers, chills, night sweats, weight loss, swollen lymph nodes, body aches, joint swelling, muscle aches, chest pain, shortness of breath, mood changes.   Objective Blood pressure 126/78, pulse 83, weight 255 lb (115.667 kg), SpO2 95 %.  General: No apparent distress alert and oriented x3 mood  and affect normal, dressed appropriately.  HEENT: Pupils equal, extraocular movements intact  Respiratory: Patient's speak in full sentences and does not appear short of breath  Cardiovascular: No lower extremity edema, non tender, no erythema  Skin: Warm dry intact with no signs of infection or rash on extremities or on axial skeleton.  Abdomen: Soft nontender  Neuro: Cranial nerves II through XII are intact, neurovascularly intact in all extremities with 2+ DTRs and 2+ pulses.  Lymph: No lymphadenopathy of posterior or anterior cervical chain or axillae bilaterally.  Gait normal with good balance and coordination.  MSK:  Non tender with full range of motion and good stability and symmetric strength and tone of shoulders, elbows, wrist, hip, knee and ankles bilaterally.  Back Exam:  Inspection: Unremarkable  Motion: Flexion 35 deg, Extension 30 deg with pain right side, Side Bending to 35 deg bilaterally,  Rotation to 35 deg bilaterally  SLR laying: Positive right XSLR laying: Negative Palpable tenderness: Severe tenderness over L5-S1 especially over the right SI joint FABER: Positive right Sensory change: Gross sensation intact to all lumbar and sacral dermatomes.  Reflexes: 2+ at both patellar tendons, 2+ at achilles tendons, Babinski's downgoing.  Strength at foot  Plantar-flexion: 5/5 Dorsi-flexion: 5/5 Eversion: 5/5 Inversion: 5/5  Leg strength  Quad: 5/5 Hamstring: 5/5 Hip flexor: 5/5 Hip abductors: 4/5  Gait unremarkable.  Osteopathic findings Cervical C4 flexed rotated and side bent left Thoracic T3 extended rotated and side bent left Lumbar L2 flexed rotated and side bent right Sacrum Right on right   Impression and Recommendations:     This case required medical decision making of moderate complexity.

## 2014-09-26 ENCOUNTER — Encounter: Payer: Self-pay | Admitting: Family Medicine

## 2014-09-28 ENCOUNTER — Encounter: Payer: Self-pay | Admitting: Family Medicine

## 2014-09-28 ENCOUNTER — Ambulatory Visit (INDEPENDENT_AMBULATORY_CARE_PROVIDER_SITE_OTHER): Payer: No Typology Code available for payment source | Admitting: Family Medicine

## 2014-09-28 ENCOUNTER — Ambulatory Visit: Payer: No Typology Code available for payment source | Admitting: Family Medicine

## 2014-09-28 VITALS — BP 126/80 | HR 89 | Ht 68.0 in | Wt 248.0 lb

## 2014-09-28 DIAGNOSIS — M9904 Segmental and somatic dysfunction of sacral region: Secondary | ICD-10-CM

## 2014-09-28 DIAGNOSIS — M5416 Radiculopathy, lumbar region: Secondary | ICD-10-CM

## 2014-09-28 DIAGNOSIS — M9902 Segmental and somatic dysfunction of thoracic region: Secondary | ICD-10-CM

## 2014-09-28 DIAGNOSIS — M9903 Segmental and somatic dysfunction of lumbar region: Secondary | ICD-10-CM

## 2014-09-28 DIAGNOSIS — M999 Biomechanical lesion, unspecified: Secondary | ICD-10-CM

## 2014-09-28 MED ORDER — HYDROCODONE-ACETAMINOPHEN 7.5-325 MG PO TABS: 1.0000 | ORAL_TABLET | Freq: Three times a day (TID) | ORAL | Status: DC | PRN

## 2014-09-28 NOTE — Progress Notes (Signed)
Brandy Brandy Bryant D.O. [facility] [address] [place], [place] [address] Phone: [phone] Subjective:     CC: Low back pain with radiculopathy. Follow up   BJY:NWGNFAOZHYHPI:Subjective Brandy Bryant is a 44 y.o. female coming in with complaint of low back pain with radiculopathy.  Patient was seen no for low back pain. There was a concern for patient having more of a lumbar radiculopathy. Patient has had an MRI previously showing an L5-S1 mild impingement. Patient though has been responding fairly well to conservative therapy. Patient states for 2 weeks after the last minute relation she was doing significantly well since having increasing pain again. Patient states it is more of a dull throbbing aching sensation. Patient states though that she had to call out of work on Tuesday of this week because of the pain. Patient states that the tramadol has not been helpful. When she has is the flares. Patient continues to gabapentin as well as all the vitamins. Patient denies any significant radiation down the leg but does have some mild numbness she has noticed recently in the left buttocks.   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    Pertinent imaging 05/26/2014 Past imaging-MRI  does have mild to moderate bulging of a L5-S1 disc but no true nerve root impingement.      Past medical history, social, surgical and family history all reviewed in electronic medical record.   Review of Systems: No headache, visual changes, nausea, vomiting, diarrhea, constipation, dizziness, abdominal pain, skin rash, fevers, chills, night sweats, weight loss, swollen lymph nodes, body aches, joint swelling, muscle aches, chest pain, shortness of breath, mood changes.   Objective Blood pressure 126/80, pulse 89, height 5\' 8"  (1.727 m), weight 248 lb (112.492  kg), SpO2 98 %.  General: No apparent distress alert and oriented x3 mood and affect normal, dressed appropriately.  HEENT: Pupils equal, extraocular movements intact  Respiratory: Patient's speak in full sentences and does not appear short of breath  Cardiovascular: No lower extremity edema, non tender, no erythema  Skin: Warm dry intact with no signs of infection or rash on extremities or on axial skeleton.  Abdomen: Soft nontender  Neuro: Cranial nerves II through XII are intact, neurovascularly intact in all extremities with 2+ DTRs and 2+ pulses.  Lymph: No lymphadenopathy of posterior or anterior cervical chain or axillae bilaterally.  Gait normal with good balance and coordination.  MSK:  Non tender with full range of motion and good stability and symmetric strength and tone of shoulders, elbows, wrist, hip, knee and ankles bilaterally.  Back Exam:  Inspection: Unremarkable  Motion: Flexion 35 deg, Extension 30 deg with pain right side, Side Bending to 35 deg bilaterally,  Rotation to 35 deg bilaterally  SLR laying: Positive right XSLR laying: Negative Palpable tenderness: Severe tenderness over L5-S1 especially over the left sacroiliac joint FABER: Positive left Sensory change: Gross sensation intact to all lumbar and sacral dermatomes.  Reflexes: 2+ at both patellar tendons, 2+ at achilles tendons, Babinski's downgoing.  Strength at foot  Plantar-flexion: 5/5 Dorsi-flexion: 5/5 Eversion: 5/5 Inversion: 5/5  Leg strength  Quad: 5/5 Hamstring: 5/5 Hip flexor: 5/5 Hip abductors: 4/5  Gait unremarkable.  Osteopathic findings Cervical C4 flexed rotated and side bent left Thoracic T3 extended rotated and side bent left Lumbar L2 flexed rotated and side bent right  Sacrum Right on right    Impression and Recommendations:     This case required medical decision making of moderate complexity.

## 2014-09-28 NOTE — Assessment & Plan Note (Signed)
Patient has had right as well as left side with radicular symptoms. Patient has it now more on the left side. I do believe that patient's L5-S1 bulging disc probably goes side to side. Patient though is responding fairly well to manipulation. We will continue and hope patient will be able to have longer duration of having pain-free. Patient's given a prescription for Norco to see if this will help her avoiding missing work. We discussed the other medicines and what possible interactions can occur. Patient and will come back and see me again in 3 weeks for further evaluation and treatment.

## 2014-09-28 NOTE — Patient Instructions (Signed)
Good to see you Ice is your friend Norco when you really need it.  Conitnue the other vitamins and medicines.  If worsening symptoms call me and we will try an epidural.   Tennis ball back left pocket with sitting long amount of time.  See me again in 3 weeks.

## 2014-09-28 NOTE — Progress Notes (Signed)
Pre visit review using our clinic review tool, if applicable. No additional management support is needed unless otherwise documented below in the visit note. 

## 2014-09-28 NOTE — Assessment & Plan Note (Signed)
Decision today to treat with OMT was based on Physical Exam  After verbal consent patient was treated with HVLA, ME, techniques in cervical, thoracic, lumbar and sacral areas  Patient tolerated the procedure well with improvement in symptoms  Patient given exercises, stretches and lifestyle modifications  See medications in patient instructions if given  Patient will follow up in 3 weeks

## 2014-10-03 ENCOUNTER — Ambulatory Visit: Payer: No Typology Code available for payment source | Admitting: Family Medicine

## 2014-10-19 ENCOUNTER — Ambulatory Visit (INDEPENDENT_AMBULATORY_CARE_PROVIDER_SITE_OTHER): Payer: No Typology Code available for payment source | Admitting: Family Medicine

## 2014-10-19 ENCOUNTER — Encounter: Payer: Self-pay | Admitting: Family Medicine

## 2014-10-19 VITALS — BP 130/76 | HR 86 | Ht 68.0 in | Wt 249.0 lb

## 2014-10-19 DIAGNOSIS — M5416 Radiculopathy, lumbar region: Secondary | ICD-10-CM

## 2014-10-19 DIAGNOSIS — M7710 Lateral epicondylitis, unspecified elbow: Secondary | ICD-10-CM | POA: Insufficient documentation

## 2014-10-19 DIAGNOSIS — M7711 Lateral epicondylitis, right elbow: Secondary | ICD-10-CM

## 2014-10-19 NOTE — Assessment & Plan Note (Signed)
Patient's lumbar radiculopathy seems to be doing significantly better at this time. Encourage patient to continue the gabapentin and the home exercises. Osteopathic medication was not needed today.

## 2014-10-19 NOTE — Progress Notes (Signed)
Pre visit review using our clinic review tool, if applicable. No additional management support is needed unless otherwise documented below in the visit note. 

## 2014-10-19 NOTE — Patient Instructions (Signed)
Good to meet you Ice 20 minutes 2 times daily. Usually after activity and before bed. Exercises 3 times a week.  Wear brace day and night for 2 weeks then nightly for 2 weeks.  Lift with thumbs up or underhand.  At work get elbows above wrists.  See me  Again in 3-4 weeks.

## 2014-10-19 NOTE — Assessment & Plan Note (Signed)
Patient does have lateral pillars at the very small tear. I think she'll do well with conservative therapy. Patient will do topical anti-inflammatory's, icing, home exercises and patient learned more home exercises with athletic trainer today. We discussed what activities to potentially avoid and patient was put in a wrist brace and we'll avoid resisted or repetitive wrist extension. Patient come back in 3-4 weeks.

## 2014-10-19 NOTE — Progress Notes (Signed)
Brandy Bryant [address] [place], [place] [address] Phone: [phone] Subjective:     CC: Low back pain with radiculopathy. Follow up   Brandy Bryant is a 44 y.o. female coming in with complaint of low back pain with radiculopathy.  Patient was seen no for low back pain. There was a concern for patient having more of a lumbar radiculopathy. Patient has had an MRI previously showing an L5-S1 mild impingement. Patient though has been responding fairly well to conservative therapy. Patient has been doing the home exercises and did respond well to osteopathic manipulation previously. Patient is also been taking gabapentin at night. Patient states her back is doing significantly better. Patient states that she is about 95% better.  She has a new problem. Patient has right elbow pain. States that it has been severe. States that when then she has the pain she feels that she has weakness in her hand. Certain activities make it worse. Discuss it as a dull throbbing ache with sharp pain. Patient denies any swelling. 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    Pertinent imaging 05/26/2014 Past imaging-MRI  does have mild to moderate bulging of a L5-S1 disc but no true nerve root impingement.      Past medical history, social, surgical and family history all reviewed in electronic medical record.   Review of Systems: No headache, visual changes, nausea, vomiting, diarrhea, constipation, dizziness, abdominal pain, skin rash, fevers, chills, night sweats, weight loss, swollen lymph nodes, body aches, joint swelling, muscle aches, chest pain, shortness of breath, mood changes.   Objective Blood pressure 130/76, pulse 86, height  (1.727 m), weight 249 lb (112.946 kg),  SpO2 97 %.  General: No apparent distress alert and oriented x3 mood and affect normal, dressed appropriately.  HEENT: Pupils equal, extraocular movements intact  Respiratory: Patient's speak in full sentences and does not appear short of breath  Cardiovascular: No lower extremity edema, non tender, no erythema  Skin: Warm dry intact with no signs of infection or rash on extremities or on axial skeleton.  Abdomen: Soft nontender  Neuro: Cranial nerves II through XII are intact, neurovascularly intact in all extremities with 2+ DTRs and 2+ pulses.  Lymph: No lymphadenopathy of posterior or anterior cervical chain or axillae bilaterally.  Gait normal with good balance and coordination.  MSK:  Non tender with full range of motion and good stability and symmetric strength and tone of shoulders, wrist, hip, knee and ankles bilaterally.  Back Exam:  Inspection: Unremarkable  Motion: Flexion 35 deg, Extension 30 deg with pain right side, Side Bending to 35 deg bilaterally,  Rotation to 35 deg bilaterally  SLR laying: Negative XSLR laying: Negative Palpable tenderness: Nontender FABER: Positive left still present Sensory change: Gross sensation intact to all lumbar and sacral dermatomes.  Reflexes: 2+ at both patellar tendons, 2+ at achilles tendons, Babinski's downgoing.  Strength at foot  Plantar-flexion: 5/5 Dorsi-flexion: 5/5 Eversion: 5/5 Inversion: 5/5  Leg strength  Quad: 5/5 Hamstring: 5/5 Hip flexor: 5/5 Hip abductors: 4/5  Gait unremarkable.  Elbow: Right Unremarkable to inspection. Range of motion full pronation, supination, flexion, extension. Strength is full to all of the above directions Stable to varus, valgus stress. Negative moving valgus stress test. Patient over the lateral epicondylar region.  Pain with resisted extension of the middle finger Ulnar nerve does not sublux. Negative cubital tunnel Tinel's. Lateral elbow unremarkable  Musculoskeletal ultrasound was  performed and interpreted by Terrilee FilesZach Smith D.O.   Elbow:  Lateral epicondyle and common extensor tendon origin visualized.  Patient does have hypoechoic changes and does appear to have more of a lateral tear. Mild increase in Doppler flow Radial head unremarkable and located in annular ligament Medial epicondyle and common flexor tendon origin visualized.  No edema, effusions, or avulsions seen. Ulnar nerve in cubital tunnel unremarkable. Olecranon and triceps insertion visualized and unremarkable without edema, effusion, or avulsion.  No signs olecranon bursitis.   IMPRESSION:  Lateral epicondylitis   Procedure note 97110; 15 minutes spent for Therapeutic exercises as stated in above notes.  This included exercises focusing on stretching, strengthening, with significant focus on eccentric aspects.   Proper technique shown and discussed handout in great detail with ATC.  All questions were discussed and answered.      Impression and Recommendations:     This case required medical decision making of moderate complexity.

## 2014-11-12 ENCOUNTER — Telehealth: Payer: Self-pay | Admitting: Family Medicine

## 2014-11-12 ENCOUNTER — Ambulatory Visit (INDEPENDENT_AMBULATORY_CARE_PROVIDER_SITE_OTHER): Payer: No Typology Code available for payment source | Admitting: Family Medicine

## 2014-11-12 ENCOUNTER — Encounter: Payer: Self-pay | Admitting: Family Medicine

## 2014-11-12 ENCOUNTER — Other Ambulatory Visit (INDEPENDENT_AMBULATORY_CARE_PROVIDER_SITE_OTHER): Payer: No Typology Code available for payment source

## 2014-11-12 VITALS — BP 132/80 | HR 91 | Ht 68.0 in | Wt 254.0 lb

## 2014-11-12 DIAGNOSIS — M7550 Bursitis of unspecified shoulder: Secondary | ICD-10-CM | POA: Insufficient documentation

## 2014-11-12 DIAGNOSIS — M7711 Lateral epicondylitis, right elbow: Secondary | ICD-10-CM

## 2014-11-12 DIAGNOSIS — M7551 Bursitis of right shoulder: Secondary | ICD-10-CM

## 2014-11-12 DIAGNOSIS — M25511 Pain in right shoulder: Secondary | ICD-10-CM

## 2014-11-12 DIAGNOSIS — M5416 Radiculopathy, lumbar region: Secondary | ICD-10-CM

## 2014-11-12 MED ORDER — NITROGLYCERIN 0.2 MG/HR TD PT24: MEDICATED_PATCH | TRANSDERMAL | Status: DC

## 2014-11-12 NOTE — Assessment & Plan Note (Signed)
Stable at this time and encourage patient to continue the exercises and monitor her weight as well as core strengthening.

## 2014-11-12 NOTE — Patient Instructions (Addendum)
Good to see you Injected shoulder today to see if it helps Ice is your friend For the elbow try brace at night and can do during the day for another 2 weeks.  See me again in 3-4 weeks.

## 2014-11-12 NOTE — Assessment & Plan Note (Signed)
Patient is given an injection today. We discussed home exercises in greater detail especially with inversion, external rotation and internal rotation. We discussed the importance of abduction and strengthening exercises. Patient will try to do these on a regular basis and come back and see me again in 3-4 weeks.

## 2014-11-12 NOTE — Telephone Encounter (Signed)
Patient states Dr. Katrinka BlazingSmith was to prescribe a nitroglycerin patch.  Wants to make sure that was sent to [facility] in [place].

## 2014-11-12 NOTE — Progress Notes (Signed)
Pre visit review using our clinic review tool, if applicable. No additional management support is needed unless otherwise documented below in the visit note. 

## 2014-11-12 NOTE — Assessment & Plan Note (Signed)
Patient will continue to monitor. Patient will wear the wrist brace regularly at night still. Continue the topical antifungal towards. We discussed home exercises in greater detail. Patient will start doing some strengthening exercises as well. Patient will come back and see me again in 3-4 weeks

## 2014-11-12 NOTE — Progress Notes (Signed)
Brandy Bryant [address] [place], [place] [address] Phone: [phone] Subjective:     CC: Folllow up back and elbow.   LKG:[id number] Brandy Bryant is a 44 y.o. female coming in with complaint of low back pain with radiculopathy.  Patient was seen no for low back pain. There was a concern for patient having more of a lumbar radiculopathy. Patient has had an MRI previously showing an L5-S1 mild impingement. Patient has been responding very well to conservative therapy including osteopathic manipulation for some time now. Patient states overall has been doing very well. No flares at this time. No radicular symptoms. Patient is happy with the results.  Patient is also having right elbow pain. Patient was found to have more of a lateral epicondylitis. She was to do bracing, home exercises, icing protocol as well as topical anti-inflammatories. Patient was to avoid extension of the wrist. Patient states that this is improved somewhat. Patient states that she can do daily activities now without as much pain. Denies any radiation of pain or any numbness or tingling in the hands. States that she wouldn't Date she is 50% better.  Patient is complaining of a new problem. Right shoulder pain. States that hurts with certain motions. Patient does have a past medical history significant for rotator cuff repair numerous years ago. Patient denies any weakness but states that certain movements consider severe sharp pain. Sometimes as a dull aching sensation at night. Patient rates the severity of pain a 7 out of 10.  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    Pertinent past  imaging 05/26/2014 Past imaging-MRI  does have mild to moderate bulging of a L5-S1 disc but no true nerve root impingement.      Past medical  history, social, surgical and family history all reviewed in electronic medical record.   Review of Systems: No headache, visual changes, nausea, vomiting, diarrhea, constipation, dizziness, abdominal pain, skin rash, fevers, chills, night sweats, weight loss, swollen lymph nodes, body aches, joint swelling, muscle aches, chest pain, shortness of breath, mood changes.   Objective Blood pressure 132/80, pulse 91, height  (1.727 m), weight 254 lb (115.214 kg), SpO2 97 %.  General: No apparent distress alert and oriented x3 mood and affect normal, dressed appropriately.  HEENT: Pupils equal, extraocular movements intact  Respiratory: Patient's speak in full sentences and does not appear short of breath  Cardiovascular: No lower extremity edema, non tender, no erythema  Skin: Warm dry intact with no signs of infection or rash on extremities or on axial skeleton.  Abdomen: Soft nontender  Neuro: Cranial nerves II through XII are intact, neurovascularly intact in all extremities with 2+ DTRs and 2+ pulses.  Lymph: No lymphadenopathy of posterior or anterior cervical chain or axillae bilaterally.  Gait normal with good balance and coordination.  MSK:  Non tender with full range of motion and good stability and symmetric strength and tone of shoulders, wrist, hip, knee and ankles bilaterally.  Back Exam:  Inspection: Unremarkable  Motion: Flexion 35 deg, Extension 30 deg with pain right side, Side Bending to 35 deg bilaterally,  Rotation to 35 deg bilaterally  SLR laying: Negative XSLR laying: Negative Palpable tenderness: Nontender FABER: Positive left still present Sensory change: Gross sensation intact to all lumbar and sacral dermatomes.  Reflexes: 2+ at  both patellar tendons, 2+ at achilles tendons, Babinski's downgoing.  Strength at foot  Plantar-flexion: 5/5 Dorsi-flexion: 5/5 Eversion: 5/5 Inversion: 5/5  Leg strength  Quad: 5/5 Hamstring: 5/5 Hip flexor: 5/5 Hip abductors: 4/5    Gait unremarkable.  Elbow: Right Unremarkable to inspection. Range of motion full pronation, supination, flexion, extension. Strength is full to all of the above directions Stable to varus, valgus stress. Negative moving valgus stress test. Patient over the lateral epicondylar region. Pain with resisted extension of the middle finger is improved from previous exam though. Ulnar nerve does not sublux. Negative cubital tunnel Tinel's. Contralateral elbow unremarkable  Shoulder: Right Inspection reveals no abnormalities, atrophy or asymmetry. Palpation is normal with no tenderness over AC joint or bicipital groove. ROM is full in all planes passively. Rotator cuff strength normal throughout. signs of impingement with positive Neer and Hawkin's tests, but negative empty can sign. Speeds and Yergason's tests normal. No labral pathology noted with negative Obrien's, negative clunk and good stability. Normal scapular function observed. No painful arc and no drop arm sign. No apprehension sign  MSK US performed of: Right This study was ordered, performed, and interpreted by Terrilee FilesZach Kathaleya Mcduffee D.O.  Shoulder:   Supraspinatus:  Appears normal on long and transverse views, Bursal bulge seen with shoulder abduction on impingement view. Infraspinatus:  Appears normal on long and transverse views. Significant increase in Doppler flow Subscapularis:  Appears normal on long and transverse views. Positive bursa Teres Minor:  Appears normal on long and transverse views. AC joint:  Capsule undistended, no geyser sign. Glenohumeral Joint:  Appears normal without effusion. Glenoid Labrum:  Intact without visualized tears. Biceps Tendon:  Appears normal on long and transverse views, no fraying of tendon, tendon located in intertubercular groove, no subluxation with shoulder internal or external rotation.  Impression: Subacromial bursitis  Procedure: Real-time Ultrasound Guided Injection of right  glenohumeral joint Device: GE Logiq E  Ultrasound guided injection is preferred based studies that show increased duration, increased effect, greater accuracy, decreased procedural pain, increased response rate with ultrasound guided versus blind injection.  Verbal informed consent obtained.  Time-out conducted.  Noted no overlying erythema, induration, or other signs of local infection.  Skin prepped in a sterile fashion.  Local anesthesia: Topical Ethyl chloride.  With sterile technique and under real time ultrasound guidance:  Joint visualized.  23g 1  inch needle inserted posterior approach. Pictures taken for needle placement. Patient did have injection of 2 cc of 1% lidocaine, 2 cc of 0.5% Marcaine, and 1.0 cc of Kenalog 40 mg/dL. Completed without difficulty  Pain immediately resolved suggesting accurate placement of the medication.  Advised to call if fevers/chills, erythema, induration, drainage, or persistent bleeding.  Images permanently stored and available for review in the ultrasound unit.  Impression: Technically successful ultrasound guided injection.      Impression and Recommendations:     This case required medical decision making of moderate complexity.

## 2014-11-12 NOTE — Telephone Encounter (Signed)
Sent rx in to pharmacy 

## 2014-12-11 ENCOUNTER — Ambulatory Visit (INDEPENDENT_AMBULATORY_CARE_PROVIDER_SITE_OTHER): Payer: No Typology Code available for payment source | Admitting: Family Medicine

## 2014-12-11 ENCOUNTER — Encounter: Payer: Self-pay | Admitting: Family Medicine

## 2014-12-11 VITALS — BP 132/82 | HR 103 | Wt 252.0 lb

## 2014-12-11 DIAGNOSIS — M5416 Radiculopathy, lumbar region: Secondary | ICD-10-CM

## 2014-12-11 DIAGNOSIS — M7711 Lateral epicondylitis, right elbow: Secondary | ICD-10-CM

## 2014-12-11 DIAGNOSIS — M7551 Bursitis of right shoulder: Secondary | ICD-10-CM

## 2014-12-11 NOTE — Assessment & Plan Note (Signed)
Discussed with patient at this time. We discussed home exercises and icing protocol. We discussed the possibility of using the nitroglycerin concern patches on the shoulder. We discussed home exercises and how this is important. We discussed what activities to potentially avoid. We discussed postural changes that can be helpful and ergonomic changes at work. Patient and will come back and see me again in 6 weeks for further evaluation and treatment.

## 2014-12-11 NOTE — Assessment & Plan Note (Signed)
Continues to be well controlled.

## 2014-12-11 NOTE — Assessment & Plan Note (Signed)
Significantly improved at this time. Encourage patient to do a six-week taper off the nitroglycerin at this time. We discussed icing regimen and continuing to do certain activities. Patient will come back and see me again in 6 weeks for further evaluation and treatment.

## 2014-12-11 NOTE — Patient Instructions (Addendum)
Good to see you Ice is still your friend Alternate the nitro patches for the shoulder and elbow for next 3 weeks daily then every other day for another 3 weeks.  I am glad you back is great but stretch after gardening.  Watch positioning of hand when numbness or weakness occurs.  Try to make changes with typing and sitting and see if anything causes it more.   See me again in 6 weeks.  If wrist is worse or not better we will look into.

## 2014-12-11 NOTE — Progress Notes (Signed)
[facility] [address] [place], [place] [address] Phone: [phone] Subjective:     CC: Folllow up back and elbow.   [id number] Brandy Bryant is a 44 y.o. female coming in with complaint of low back pain with radiculopathy.  Patient was seen no for low back pain. There was a concern for patient having more of a lumbar radiculopathy. Patient has had an MRI previously showing an L5-S1 mild impingement. Patient has been responding very well to conservative therapy including osteopathic manipulation for some time now. Patient states back has not given her any difficulty whatsoever. Patient states only when she is doing something in a flexed position for a long amount of time did she have some tightness. Denies any radiation down the leg or any numbness or tingling. Patient is happy with the results. .  Patient is also having right elbow pain. Patient was diagnosed with a lateral epicondylitis and was 50% better with conservative therapy. Patient had also started the nitroglycerin patch. Patient states that she is 95% better still some mild dull aching pain at the end of a long day repetitive movements but otherwisenegative in pain. Nothing that his stopping her from activity.  Patient also had right shoulder pain. Patient did have an injection in the right shoulder for a subacromial bursitis. Was do conservative therapy including home exercises, icing protocol as well as over-the-counter natural supplementations. Patient states she is doing approximate 80% better. Still mild dull posterior pain. Nothing that stops her from activity. Nothing waking her up at night. Overall can tolerate.  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    Pertinent past  imaging 05/26/2014 Past imaging-MRI  does have mild to  moderate bulging of a L5-S1 disc but no true nerve root impingement.     Past medical history, social, surgical and family history all reviewed in electronic medical record.   Review of Systems: No headache, visual changes, nausea, vomiting, diarrhea, constipation, dizziness, abdominal pain, skin rash, fevers, chills, night sweats, weight loss, swollen lymph nodes, body aches, joint swelling, muscle aches, chest pain, shortness of breath, mood changes.   Objective Blood pressure 132/82, pulse 103, weight 252 lb (114.306 kg), SpO2 97 %.  General: No apparent distress alert and oriented x3 mood and affect normal, dressed appropriately.  HEENT: Pupils equal, extraocular movements intact  Respiratory: Patient's speak in full sentences and does not appear short of breath  Cardiovascular: No lower extremity edema, non tender, no erythema  Skin: Warm dry intact with no signs of infection or rash on extremities or on axial skeleton.  Abdomen: Soft nontender  Neuro: Cranial nerves II through XII are intact, neurovascularly intact in all extremities with 2+ DTRs and 2+ pulses.  Lymph: No lymphadenopathy of posterior or anterior cervical chain or axillae bilaterally.  Gait normal with good balance and coordination.  MSK:  Non tender with full range of motion and good stability and symmetric strength and tone of shoulders, wrist, hip, knee and ankles bilaterally.  Back Exam:  Inspection: Unremarkable  Motion: Flexion 35 deg, Extension 30 deg with no pain which is an improvement, Side Bending to 35 deg bilaterally,  Rotation to 35 deg bilaterally  SLR laying: Negative XSLR laying: Negative Palpable tenderness: Nontender FABER: Negative is an improvement Sensory change: Gross sensation intact to all lumbar and sacral  dermatomes.  Reflexes: 2+ at both patellar tendons, 2+ at achilles tendons, Babinski's downgoing.  Strength at foot  Plantar-flexion: 5/5 Dorsi-flexion: 5/5 Eversion: 5/5 Inversion: 5/5    Leg strength  Quad: 5/5 Hamstring: 5/5 Hip flexor: 5/5 Hip abductors: 4/5  Gait unremarkable.  Elbow: Right Unremarkable to inspection. Range of motion full pronation, supination, flexion, extension. Strength is full to all of the above directions Stable to varus, valgus stress. Negative moving valgus stress test. Minimal tenderness over the lateral epicondylar region and no pain with resisted extension of the wrist. Ulnar nerve does not sublux. Negative cubital tunnel Tinel's. Contralateral elbow unremarkable  Shoulder: Right Inspection reveals no abnormalities, atrophy or asymmetry. Palpation is normal with no tenderness over AC joint or bicipital groove. ROM is full in all planes passively. Rotator cuff strength normal throughout. signs of impingement with positive Neer and Hawkin's tests, but negative empty can sign. Mild improvement from previous exam Speeds and Yergason's tests normal. No labral pathology noted with negative Obrien's, negative clunk and good stability. Normal scapular function observed. No painful arc and no drop arm sign. No apprehension sign      Impression and Recommendations:     This case required medical decision making of moderate complexity.

## 2014-12-11 NOTE — Progress Notes (Signed)
Pre visit review using our clinic review tool, if applicable. No additional management support is needed unless otherwise documented below in the visit note. 

## 2015-01-19 ENCOUNTER — Encounter: Payer: Self-pay | Admitting: *Deleted

## 2015-01-19 ENCOUNTER — Emergency Department
Admission: EM | Admit: 2015-01-19 | Discharge: 2015-01-19 | Disposition: A | Discharge status: Home / Self Care | Payer: No Typology Code available for payment source | Attending: Emergency Medicine | Admitting: Emergency Medicine

## 2015-01-19 ENCOUNTER — Other Ambulatory Visit: Payer: Self-pay

## 2015-01-19 ENCOUNTER — Emergency Department: Payer: No Typology Code available for payment source

## 2015-01-19 DIAGNOSIS — Z794 Long term (current) use of insulin: Secondary | ICD-10-CM | POA: Insufficient documentation

## 2015-01-19 DIAGNOSIS — G510 Bell's palsy: Secondary | ICD-10-CM | POA: Insufficient documentation

## 2015-01-19 DIAGNOSIS — Z87891 Personal history of nicotine dependence: Secondary | ICD-10-CM | POA: Diagnosis not present

## 2015-01-19 DIAGNOSIS — R209 Unspecified disturbances of skin sensation: Secondary | ICD-10-CM

## 2015-01-19 DIAGNOSIS — R202 Paresthesia of skin: Secondary | ICD-10-CM

## 2015-01-19 DIAGNOSIS — Z791 Long term (current) use of non-steroidal anti-inflammatories (NSAID): Secondary | ICD-10-CM | POA: Diagnosis not present

## 2015-01-19 DIAGNOSIS — R079 Chest pain, unspecified: Secondary | ICD-10-CM | POA: Diagnosis not present

## 2015-01-19 DIAGNOSIS — Z792 Long term (current) use of antibiotics: Secondary | ICD-10-CM | POA: Diagnosis not present

## 2015-01-19 DIAGNOSIS — E119 Type 2 diabetes mellitus without complications: Secondary | ICD-10-CM | POA: Insufficient documentation

## 2015-01-19 DIAGNOSIS — I10 Essential (primary) hypertension: Secondary | ICD-10-CM | POA: Insufficient documentation

## 2015-01-19 DIAGNOSIS — Z79899 Other long term (current) drug therapy: Secondary | ICD-10-CM | POA: Insufficient documentation

## 2015-01-19 DIAGNOSIS — Z880 Allergy status to penicillin: Secondary | ICD-10-CM | POA: Diagnosis not present

## 2015-01-19 DIAGNOSIS — R200 Anesthesia of skin: Secondary | ICD-10-CM | POA: Diagnosis present

## 2015-01-19 LAB — COMPREHENSIVE METABOLIC PANEL
ALT: 23 U/L (ref 14–54)
AST: 25 U/L (ref 15–41)
Albumin: 3.6 g/dL (ref 3.5–5.0)
Alkaline Phosphatase: 69 U/L (ref 38–126)
Anion gap: 8 (ref 5–15)
BUN: 13 mg/dL (ref 6–20)
CALCIUM: 8.9 mg/dL (ref 8.9–10.3)
CO2: 27 mmol/L (ref 22–32)
Chloride: 101 mmol/L (ref 101–111)
Creatinine, Ser: 0.72 mg/dL (ref 0.44–1.00)
GFR calc Af Amer: >60 mL/min (ref >60)
GFR calc non Af Amer: >60 mL/min (ref >60)
GLUCOSE: 171 mg/dL — AB (ref 65–99)
POTASSIUM: 3.8 mmol/L (ref 3.5–5.1)
SODIUM: 136 mmol/L (ref 135–145)
Total Bilirubin: 1.6 mg/dL — ABNORMAL HIGH (ref 0.3–1.2)
Total Protein: 6.9 g/dL (ref 6.5–8.1)

## 2015-01-19 LAB — CBC
HCT: 41.9 % (ref 35.0–47.0)
HEMOGLOBIN: 14.1 g/dL (ref 12.0–16.0)
MCH: 30.9 pg (ref 26.0–34.0)
MCHC: 33.6 g/dL (ref 32.0–36.0)
MCV: 91.8 fL (ref 80.0–100.0)
Platelets: 239 10*3/uL (ref 150–440)
RBC: 4.56 MIL/uL (ref 3.80–5.20)
RDW: 12.5 % (ref 11.5–14.5)
WBC: 9.3 10*3/uL (ref 3.6–11.0)

## 2015-01-19 LAB — TROPONIN I

## 2015-01-19 MED ORDER — HYDROCODONE-ACETAMINOPHEN 5-325 MG PO TABS
ORAL_TABLET | ORAL | Status: AC
  Administered 2015-01-19: 1 via ORAL
  Filled 2015-01-19: qty 1

## 2015-01-19 MED ORDER — HYDROCODONE-ACETAMINOPHEN 5-325 MG PO TABS
1.0000 | ORAL_TABLET | Freq: Once | ORAL | Status: AC
  Administered 2015-01-19: 1 via ORAL

## 2015-01-19 MED ORDER — ACYCLOVIR 400 MG PO TABS: 400.0000 mg | ORAL_TABLET | Freq: Four times a day (QID) | ORAL | Status: DC

## 2015-01-19 MED ORDER — HYDROCODONE-ACETAMINOPHEN 5-325 MG PO TABS: 1.0000 | ORAL_TABLET | ORAL | Status: DC | PRN

## 2015-01-19 MED ORDER — ACYCLOVIR 800 MG PO TABS
800.0000 mg | ORAL_TABLET | Freq: Once | ORAL | Status: AC
  Administered 2015-01-19: 800 mg via ORAL
  Filled 2015-01-19: qty 1

## 2015-01-19 NOTE — ED Provider Notes (Signed)
[facility] Emergency Department Provider Note  ____________________________________________  Time seen: 2125  I have reviewed the triage vital signs and the nursing notes.   HISTORY  Chief Complaint Chest Pain  facial paresthesia    HPI Brandy Bryant is a 44 y.o. female who presents to emergency department due to facial numbness and paresthesia. It began around 4:00 this afternoon. It is on the left side of the face and extends only back towards her left ear. She is not having any weakness in her face or difficulty speaking. This evening the patient and began to have chest pain. It is mid chest and radiates to her back.  She is a diabetic with a insulin pump in place.   She denies having a headache or shortness of breath or nausea.    Past Medical History  Diagnosis Date  . Chicken pox   . Depression   . Diabetes mellitus without complication   . GERD (gastroesophageal reflux disease)   . Hypertension   . Hyperlipidemia   . Thyroid disease     Patient Active Problem List   Diagnosis Date Noted  . Shoulder bursitis 11/12/2014  . Lateral epicondylitis 10/19/2014  . Nonallopathic lesion of lumbosacral region 09/10/2014  . Nonallopathic lesion of sacral region 09/10/2014  . Nonallopathic lesion of thoracic region 09/10/2014  . Clinical depression 08/01/2014  . BP (high blood pressure) 08/01/2014  . Right lumbar radiculopathy 06/06/2014  . ADD (attention deficit disorder) 03/26/2014  . Acid reflux 04/03/2011  . Essential (primary) hypertension 04/03/2011  . HLD (hyperlipidemia) 04/03/2011  . Adult hypothyroidism 04/03/2011    Past Surgical History  Procedure Laterality Date  . Cholecystectomy  2014  . Tubal ligation    . Anterior cervical decomp/discectomy fusion    . Cervical disc arthroplasty  2014  . Rotator cuff repair  2013  . Foot surgery Left     bone spur  . Nasal sinus surgery Right   . Laparoscopic gastric sleeve  resection with hiatal hernia repair      Current Outpatient Rx  Name  Route  Sig  Dispense  Refill  . acyclovir (ZOVIRAX) 400 MG tablet   Oral   Take 1 tablet (400 mg total) by mouth 4 (four) times daily.   24 tablet   0   . buPROPion (WELLBUTRIN XL) 300 MG 24 hr tablet   Oral   Take 300 mg by mouth.         . calcium-vitamin D (SM CALCIUM 500/VITAMIN D3) 500-400 MG-UNIT per tablet   Oral   Take by mouth.         . Cyanocobalamin (RA VITAMIN B-12 TR) 1000 MCG TBCR   Oral   Take by mouth.         . Diclofenac Sodium 2 % SOLN      Apply 1 pump twice daily   112 g   3     [phone] (   . doxycycline (ORACEA) 40 MG capsule   Oral   Take 40 mg by mouth every morning.         . ferrous sulfate 325 (65 FE) MG tablet   Oral   Take 325 mg by mouth daily with breakfast.         . gabapentin (NEURONTIN) 100 MG capsule   Oral   Take 2 capsules (200 mg total) by mouth at bedtime.   60 capsule   3   . glucose blood (BAYER CONTOUR NEXT TEST)  test strip      use 8x/day; dx = 250.03; needs the  Contour next strips that link with her meter         . HYDROcodone-acetaminophen (NORCO) 5-325 MG per tablet   Oral   Take 1 tablet by mouth every 4 (four) hours as needed for moderate pain.   10 tablet   0   . HYDROcodone-acetaminophen (NORCO) 7.5-325 MG per tablet   Oral   Take 1 tablet by mouth every 8 (eight) hours as needed for moderate pain (cough).   30 tablet   0   . Incontinence Supplies (BARD PROTECTIVE BARRIER FILM) MISC      Use when changing insulin pump sites. All Kare barrier wipes is what she prefers         . insulin lispro (HUMALOG) 100 UNIT/ML injection      use in pump up to 100 units daily         . levothyroxine (SYNTHROID, LEVOTHROID) 175 MCG tablet      one po daily on 6 days a week         . meloxicam (MOBIC) 15 MG tablet   Oral   Take 1 tablet (15 mg total) by mouth daily.   90 tablet   1   . methylphenidate (RITALIN)  10 MG tablet      10 mg tablet.. take 1 tablet in am,take 1/2 tab at night..         . methylphenidate (RITALIN) 5 MG tablet   Oral   Take 5 mg by mouth.         . Multiple Vitamin (MULTI-VITAMINS) TABS   Oral   Take by mouth.         . nitroGLYCERIN (NITRO-DUR) 0.2 mg/hr patch      Place 1/4 patch onto skin daily.   30 patch   0   . PARoxetine (PAXIL) 20 MG tablet   Oral   Take 40 mg by mouth.         . pravastatin (PRAVACHOL) 40 MG tablet   Oral   Take 40 mg by mouth.         . traMADol (ULTRAM) 50 MG tablet   Oral   Take 1 tablet (50 mg total) by mouth every 8 (eight) hours as needed.   60 tablet   0     Allergies Codeine; Penicillins; Valium; and Vancomycin  Family History  Problem Relation Age of Onset  . Hyperlipidemia Mother   . Hypertension Mother   . Hyperlipidemia Father   . Hypertension Father     Social History History  Substance Use Topics  . Smoking status: Former Games developer  . Smokeless tobacco: Never Used  . Alcohol Use: No    Review of Systems  Constitutional: Negative for fever. ENT: Negative for sore throat. Cardiovascular: Positive for chest pain. Respiratory: Negative for shortness of breath. Gastrointestinal: Negative for abdominal pain, vomiting and diarrhea. Genitourinary: Negative for dysuria. Musculoskeletal: Negative for back pain. Skin: Negative for rash. Neurological: Left facial paresthesia. See history of present illness   10-point ROS otherwise negative.  ____________________________________________   PHYSICAL EXAM:  VITAL SIGNS: ED Triage Vitals  Enc Vitals Group     BP 01/19/15 2101 122/63 mmHg     Pulse Rate 01/19/15 2101 63     Resp 01/19/15 2101 18     Temp 01/19/15 2101 98.1 F (36.7 C)     Temp Source 01/19/15 2101 Oral  SpO2 01/19/15 2101 99 %     Weight 01/19/15 2101 250 lb (113.399 kg)     Height 01/19/15 2101  (1.727 m)     Head Cir --      Peak Flow --      Pain Score  01/19/15 2102 6     Pain Loc --      Pain Edu? --      Excl. in GC? --     Constitutional: Alert and oriented. Well appearing and in no distress. ENT   Head: Normocephalic and atraumatic.   Nose: No congestion/rhinnorhea.   Mouth/Throat: Mucous membranes are moist. Cardiovascular: Normal rate, regular rhythm.  Notable tenderness to the sternum and chest area. Patient reports this discomfort minute the pain she is having in her chest. Respiratory: Normal respiratory effort without tachypnea. Breath sounds are clear and equal bilaterally. No wheezes/rales/rhonchi. Gastrointestinal: Soft and nontender. No distention.  Back: No muscle spasm, no tenderness, no CVA tenderness. Musculoskeletal: Nontender with normal range of motion in all extremities.  No noted edema. Neurologic:  There is no noted focal neurologic deficit. She has 5 over 5 strength in all 4 extremities. She has a negative Romberg, negative pronator drift, negative figure to nose. She is ambulatory. Skin:  Skin is warm, dry. No rash noted. Psychiatric: Mood and affect are normal. Speech and behavior are normal.  ____________________________________________    LABS (pertinent positives/negatives)   White blood cell count 9.3 hemoglobin 14.1 Metabolic panel is overall unremarkable except for elevated glucose at 171. ____________________________________________   EKG  ED ECG REPORT I, Anni Hocevar W, the attending physician, personally viewed and interpreted this ECG.   Date: 01/19/2015  EKG Time: 2100  Rate: 66  Rhythm:  Normal sinus rhythm  Axis:  Normal  Intervals:  Normal  ST&T Change:  None   ____________________________________________    RADIOLOGY  Chest x-ray: Negative chest x-ray. No acute findings.  ____________________________________________  ____________________________________________   INITIAL IMPRESSION / ASSESSMENT AND PLAN / ED COURSE  Patient with paresthesia to her left  face. She has no focal neurologic deficit. I'm concerned she may be having the early stages of a Bell's palsy. There is some asymmetry to her face. Her husband agrees with this as well. We have talked about the treatment for this. Due to her diabetes will avoid a steroid. We will place her on an antiviral. She'll follow with her primary physician, Dr. Greggory Stallion, but if things worsen or she has any other urgent concerns she'll return to the emergency department.  ____________________________________________   FINAL CLINICAL IMPRESSION(S) / ED DIAGNOSES  Final diagnoses:  Facial paresthesia  Bell's palsy      Darien Ramus, MD 01/19/15 2323

## 2015-01-19 NOTE — ED Notes (Signed)
Pt alert and in NAD at time of d/c 

## 2015-01-19 NOTE — ED Notes (Signed)
Sudden onset, insulin dependant diabetic with insulin pump

## 2015-01-19 NOTE — Discharge Instructions (Signed)
Your head CT looks good and he had no focal neurologic deficit except for the problems with the left face. I believe this is early signs of Bell's palsy for you. We have discussed this and agreed to treatment with antivirals however we will hold any steroids to your diabetes. The return to the emergency department if you have any further symptoms or urgent concerns. Follow-up with her primary physician this coming week for ongoing care.  Bell's Palsy Bell's palsy is a condition in which the muscles on one side of the face cannot move (paralysis). This is because the nerves in the face are paralyzed. It is most often thought to be caused by a virus. The virus causes swelling of the nerve that controls movement on one side of the face. The nerve travels through a tight space surrounded by bone. When the nerve swells, it can be compressed by the bone. This results in damage to the protective covering around the nerve. This damage interferes with how the nerve communicates with the muscles of the face. As a result, it can cause weakness or paralysis of the facial muscles.  Injury (trauma), tumor, and surgery may cause Bell's palsy, but most of the time the cause is unknown. It is a relatively common condition. It starts suddenly (abrupt onset) with the paralysis usually ending within 2 days. Bell's palsy is not dangerous. But because the eye does not close properly, you may need care to keep the eye from getting dry. This can include splinting (to keep the eye shut) or moistening with artificial tears. Bell's palsy very seldom occurs on both sides of the face at the same time. SYMPTOMS   Eyebrow sagging.  Drooping of the eyelid and corner of the mouth.  Inability to close one eye.  Loss of taste on the front of the tongue.  Sensitivity to loud noises. TREATMENT  The treatment is usually non-surgical. If the patient is seen within the first 24 to 48 hours, a short course of steroids may be prescribed, in  an attempt to shorten the length of the condition. Antiviral medicines may also be used with the steroids, but it is unclear if they are helpful.  You will need to protect your eye, if you cannot close it. The cornea (clear covering over your eye) will become dry and can be damaged. Artificial tears can be used to keep your eye moist. Glasses or an eye patch should be worn to protect your eye. PROGNOSIS  Recovery is variable, ranging from days to months. Although the problem usually goes away completely (about 80% of cases resolve), predicting the outcome is impossible. Most people improve within 3 weeks of when the symptoms began. Improvement may continue for 3 to 6 months. A small number of people have moderate to severe weakness that is permanent.  HOME CARE INSTRUCTIONS   If your caregiver prescribed medication to reduce swelling in the nerve, use as directed. Do not stop taking the medication unless directed by your caregiver.  Use moisturizing eye drops as needed to prevent drying of your eye, as directed by your caregiver.  Protect your eye, as directed by your caregiver.  Use facial massage and exercises, as directed by your caregiver.  Perform your normal activities, and get your normal rest. SEEK IMMEDIATE MEDICAL CARE IF:   There is pain, redness or irritation in the eye.  You or your child has an oral temperature above 102 F (38.9 C), not controlled by medicine. MAKE SURE YOU:  Understand these instructions.  Will watch your condition.  Will get help right away if you are not doing well or get worse. Document Released: 08/10/2005 Document Revised: 11/02/2011 Document Reviewed: 11/17/2013 [facility] Patient Information 2015 [place], [place]. This information is not intended to replace advice given to you by your health care provider. Make sure you discuss any questions you have with your health care provider.

## 2015-01-22 ENCOUNTER — Ambulatory Visit (INDEPENDENT_AMBULATORY_CARE_PROVIDER_SITE_OTHER)
Admission: RE | Admit: 2015-01-22 | Discharge: 2015-01-22 | Disposition: A | Discharge status: Home / Self Care | Payer: No Typology Code available for payment source | Source: Ambulatory Visit | Attending: Family Medicine | Admitting: Family Medicine

## 2015-01-22 ENCOUNTER — Ambulatory Visit (INDEPENDENT_AMBULATORY_CARE_PROVIDER_SITE_OTHER): Payer: No Typology Code available for payment source | Admitting: Family Medicine

## 2015-01-22 ENCOUNTER — Encounter: Payer: Self-pay | Admitting: Family Medicine

## 2015-01-22 VITALS — BP 102/70 | HR 76 | Ht 68.0 in | Wt 256.0 lb

## 2015-01-22 DIAGNOSIS — M5010 Cervical disc disorder with radiculopathy, unspecified cervical region: Secondary | ICD-10-CM | POA: Diagnosis not present

## 2015-01-22 DIAGNOSIS — I10 Essential (primary) hypertension: Secondary | ICD-10-CM

## 2015-01-22 DIAGNOSIS — M542 Cervicalgia: Secondary | ICD-10-CM

## 2015-01-22 MED ORDER — GABAPENTIN 300 MG PO CAPS: ORAL_CAPSULE | ORAL | Status: DC

## 2015-01-22 MED ORDER — PREDNISONE 50 MG PO TABS: 50.0000 mg | ORAL_TABLET | Freq: Every day | ORAL | Status: DC

## 2015-01-22 NOTE — Progress Notes (Signed)
Pre visit review using our clinic review tool, if applicable. No additional management support is needed unless otherwise documented below in the visit note. 

## 2015-01-22 NOTE — Patient Instructions (Addendum)
Prednisone daily for 5 days Sorry Gabapentin 300mg  nightly New exercises 3 times a week.  Xrays downstairs today.  Ice is still your friend See me again in 2-3 weeks.    Standing:  Secure a rubber exercise band/tubing so that it is at the height of your shoulders when you are either standing or sitting on a firm arm-less chair.  Grasp an end of the band/tubing in each hand and have your palms face each other. Straighten your elbows and lift your hands straight in front of you at shoulder height. Step back away from the secured end of band/tubing until it becomes tense.  Squeeze your shoulder blades together. Keeping your elbows locked and your hands at shoulder-height, bring your hands out to your side.  Hold __________ seconds. Slowly ease the tension on the band/tubing as you reverse the directions and return to the starting position. Repeat __________ times. Complete this exercise __________ times per day. STRENGTH - Scapular Retractors  Secure a rubber exercise band/tubing so that it is at the height of your shoulders when you are either standing or sitting on a firm arm-less chair.  With a palm-down grip, grasp an end of the band/tubing in each hand. Straighten your elbows and lift your hands straight in front of you at shoulder height. Step back away from the secured end of band/tubing until it becomes tense.  Squeezing your shoulder blades together, draw your elbows back as you bend them. Keep your upper arm lifted away from your body throughout the exercise.  Hold __________ seconds. Slowly ease the tension on the band/tubing as you reverse the directions and return to the starting position. Repeat __________ times. Complete this exercise __________ times per day. STRENGTH - Shoulder Extensors   Secure a rubber exercise band/tubing so that it is at the height of your shoulders when you are either standing or sitting on a firm arm-less chair.  With a thumbs-up grip, grasp an  end of the band/tubing in each hand. Straighten your elbows and lift your hands straight in front of you at shoulder height. Step back away from the secured end of band/tubing until it becomes tense.  Squeezing your shoulder blades together, pull your hands down to the sides of your thighs. Do not allow your hands to go behind you.  Hold for __________ seconds. Slowly ease the tension on the band/tubing as you reverse the directions and return to the starting position. Repeat __________ times. Complete this exercise __________ times per day.  STRENGTH - Scapular Retractors and External Rotators  Secure a rubber exercise band/tubing so that it is at the height of your shoulders when you are either standing or sitting on a firm arm-less chair.  With a palm-down grip, grasp an end of the band/tubing in each hand. Bend your elbows 90 degrees and lift your elbows to shoulder height at your sides. Step back away from the secured end of band/tubing until it becomes tense.  Squeezing your shoulder blades together, rotate your shoulder so that your upper arm and elbow remain stationary, but your fists travel upward to head-height.  Hold __________ for seconds. Slowly ease the tension on the band/tubing as you reverse the directions and return to the starting position. Repeat __________ times. Complete this exercise __________ times per day.  STRENGTH - Scapular Retractors and External Rotators, Rowing  Secure a rubber exercise band/tubing so that it is at the height of your shoulders when you are either standing or sitting on a firm arm-less  chair.  With a palm-down grip, grasp an end of the band/tubing in each hand. Straighten your elbows and lift your hands straight in front of you at shoulder height. Step back away from the secured end of band/tubing until it becomes tense.  Step 1: Squeeze your shoulder blades together. Bending your elbows, draw your hands to your chest as if you are rowing a boat.  At the end of this motion, your hands and elbow should be at shoulder-height and your elbows should be out to your sides.  Step 2: Rotate your shoulder to raise your hands above your head. Your forearms should be vertical and your upper-arms should be horizontal.  Hold for __________ seconds. Slowly ease the tension on the band/tubing as you reverse the directions and return to the starting position. Repeat __________ times. Complete this exercise __________ times per day.  STRENGTH - Scapular Retractors and Elevators  Secure a rubber exercise band/tubing so that it is at the height of your shoulders when you are either standing or sitting on a firm arm-less chair.  With a thumbs-up grip, grasp an end of the band/tubing in each hand. Step back away from the secured end of band/tubing until it becomes tense.  Squeezing your shoulder blades together, straighten your elbows and lift your hands straight over your head.  Hold for __________ seconds. Slowly ease the tension on the band/tubing as you reverse the directions and return to the starting position. Repeat __________ times. Complete this exercise __________ times per day.  Document Released: 08/10/2005 Document Revised: 11/02/2011 Document Reviewed: 11/22/2008 [facility] Patient Information 2015 [place], [place]. This information is not intended to replace advice given to you by your health care provider. Make sure you discuss any questions you have with your health care provider.

## 2015-01-22 NOTE — Assessment & Plan Note (Signed)
I'm more concerned that patient's pain is more secondary to cervical radiculopathy. I do want to get x-rays to make sure that none effusions is causing any loosening. I do think that this is more of the C7-T1 area. We discussed different treatment options and patient has elected try prednisone and she will watch her blood sugars and adjust her insulin accordingly. We discussed home exercises and icing protocol. We discussed upper back exercises and patient did work with Event organiserathletic trainer. Patient also going to increase her gabapentin at night. If patient does not make any significant improvement I would consider an epidural and patient will come back again in 2 weeks for further evaluation and treatment.  Spent  25 minutes with patient face-to-face and had greater than 50% of counseling including as described above in assessment and plan.

## 2015-01-22 NOTE — Progress Notes (Signed)
[facility] [address] [place], [place] [address] Phone: [phone] Subjective:     CC: Folllow up back and elbow.   BJY:NWGNFAOZHY Brandy Bryant is a 44 y.o. female coming in with complaint of bilateral shoulder pain.  Patient was seen previously and was having more of a elbow as well as shoulder pain mostly on the right side. Patient started the nitroglycerin patches and doing home exercises. Patient states that it seems to be mildly better. Patient though is having very similar presentation on the contralateral side does seem to be associated with neck pain. Patient does have a past medical history significant for cervical neck fusion multiple years ago. Vision states that the pain does radiate down her arms and can go to the middle fingers bilaterally. Patient states that she has to change position fairly regularly. Denies any weakness though. Denies any nighttime awakening but states that it can be very difficult to get to sleep.  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    Pertinent past  imaging 05/26/2014 Past imaging-MRI  does have mild to moderate bulging of a L5-S1 disc but no true nerve root impingement.     Past medical history, social, surgical and family history all reviewed in electronic medical record.   Review of Systems: No headache, visual changes, nausea, vomiting, diarrhea, constipation, dizziness, abdominal pain, skin rash, fevers, chills, night sweats, weight loss, swollen lymph nodes, body aches, joint swelling, muscle aches, chest pain, shortness of breath, mood changes.   Objective Blood pressure 102/70, pulse 76, height  (1.727 m), weight 256 lb (116.121 kg), SpO2 97 %.  General: No apparent distress alert and oriented x3 mood and affect normal, dressed  appropriately.  HEENT: Pupils equal, extraocular movements intact  Respiratory: Patient's speak in full sentences and does not appear short of breath  Cardiovascular: No lower extremity edema, non tender, no erythema  Skin: Warm dry intact with no signs of infection or rash on extremities or on axial skeleton.  Abdomen: Soft nontender  Neuro: Cranial nerves II through XII are intact, neurovascularly intact in all extremities with 2+ DTRs and 2+ pulses.  Lymph: No lymphadenopathy of posterior or anterior cervical chain or axillae bilaterally.  Gait normal with good balance and coordination.  MSK:  Non tender with full range of motion and good stability and symmetric strength and tone of shoulders, wrist, hip, knee and ankles bilaterally.  Back Exam:  Inspection: Unremarkable  Motion: Flexion 35 deg, Extension 30 deg with no pain which is an improvement, Side Bending to 35 deg bilaterally,  Rotation to 35 deg bilaterally  SLR laying: Negative XSLR laying: Negative Palpable tenderness: Nontender FABER: Negative is an improvement Sensory change: Gross sensation intact to all lumbar and sacral dermatomes.  Reflexes: 2+ at both patellar tendons, 2+ at achilles tendons, Babinski's downgoing.  Strength at foot  Plantar-flexion: 5/5 Dorsi-flexion: 5/5 Eversion: 5/5 Inversion: 5/5  Leg strength  Quad: 5/5 Hamstring: 5/5 Hip flexor: 5/5 Hip abductors: 4/5  Gait unremarkable.  Neck: Inspection unremarkable. No palpable stepoffs. Positive Spurling's maneuver with radicular symptoms down both arms to the middle finger.. Full neck range of motion Grip strength and sensation normal in bilateral hands Strength good C4 to T1 distribution No sensory change to C4 to T1 Negative Hoffman sign bilaterally Reflexes normal  Procedure note  97110; 15 minutes spent for Therapeutic exercises as stated in above notes.  This included exercises focusing on stretching, strengthening, with significant focus on  eccentric aspects. Patient given scapular strengthening exercises with straight arm as well as flexed arm. Discussed proper rotational component.   Proper technique shown and discussed handout in great detail with ATC.  All questions were discussed and answered.      Impression and Recommendations:     This case required medical decision making of moderate complexity.

## 2015-01-30 ENCOUNTER — Other Ambulatory Visit: Payer: Self-pay | Admitting: *Deleted

## 2015-01-30 ENCOUNTER — Encounter: Payer: Self-pay | Admitting: Family Medicine

## 2015-01-30 DIAGNOSIS — M5010 Cervical disc disorder with radiculopathy, unspecified cervical region: Secondary | ICD-10-CM

## 2015-02-16 ENCOUNTER — Ambulatory Visit
Admission: RE | Admit: 2015-02-16 | Discharge: 2015-02-16 | Disposition: A | Discharge status: Home / Self Care | Payer: PRIVATE HEALTH INSURANCE | Source: Ambulatory Visit | Attending: Family Medicine | Admitting: Family Medicine

## 2015-02-16 DIAGNOSIS — M5010 Cervical disc disorder with radiculopathy, unspecified cervical region: Secondary | ICD-10-CM

## 2015-02-18 ENCOUNTER — Encounter: Payer: Self-pay | Admitting: Family Medicine

## 2015-02-18 ENCOUNTER — Ambulatory Visit (INDEPENDENT_AMBULATORY_CARE_PROVIDER_SITE_OTHER): Payer: No Typology Code available for payment source | Admitting: Family Medicine

## 2015-02-18 VITALS — BP 116/78 | HR 76 | Ht 68.0 in | Wt 260.0 lb

## 2015-02-18 DIAGNOSIS — M9904 Segmental and somatic dysfunction of sacral region: Secondary | ICD-10-CM

## 2015-02-18 DIAGNOSIS — M999 Biomechanical lesion, unspecified: Secondary | ICD-10-CM

## 2015-02-18 DIAGNOSIS — M9903 Segmental and somatic dysfunction of lumbar region: Secondary | ICD-10-CM | POA: Diagnosis not present

## 2015-02-18 DIAGNOSIS — M5010 Cervical disc disorder with radiculopathy, unspecified cervical region: Secondary | ICD-10-CM | POA: Diagnosis not present

## 2015-02-18 DIAGNOSIS — M9902 Segmental and somatic dysfunction of thoracic region: Secondary | ICD-10-CM | POA: Diagnosis not present

## 2015-02-18 MED ORDER — GABAPENTIN 100 MG PO CAPS: 100.0000 mg | ORAL_CAPSULE | Freq: Two times a day (BID) | ORAL | Status: DC

## 2015-02-18 MED ORDER — HYDROCODONE-ACETAMINOPHEN 5-325 MG PO TABS: 1.0000 | ORAL_TABLET | Freq: Four times a day (QID) | ORAL | Status: DC | PRN

## 2015-02-18 MED ORDER — GABAPENTIN 300 MG PO CAPS: ORAL_CAPSULE | ORAL | Status: DC

## 2015-02-18 MED ORDER — CELECOXIB 200 MG PO CAPS: ORAL_CAPSULE | ORAL | Status: DC

## 2015-02-18 NOTE — Progress Notes (Signed)
[facility] [address] [place], [place] [address] Phone: [phone] Subjective:     CC: Folllow up back and elbow.   BJY:NWGNFAOZHY Brandy Bryant is a 44 y.o. female coming in with complaint of bilateral shoulder pain.  Patient was seen previously and was having more of a elbow as well as shoulder pain mostly on the right side. Patient started the nitroglycerin patches and doing home exercises. Patient was on doing any significant improvement in with patient's history of previous neck surgeries advance imaging was warranted. MRI was done of her neck. Patient unfortunately does have a C4-C5 osteophyte that seems to be giving her some nerve root impingement. Patient states that the pain seems to be worsening overall. Denies any weakness but states that some daily activities it becoming very difficult. Patient is not sleeping comfortably. Patient is needing to rely on painkillers 1-2 times a day. Patient is taking gabapentin 100 mg in the morning and 6 mg at night.  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    Pertinent past  imaging 05/26/2014 Past imaging-MRI  does have mild to moderate bulging of a L5-S1 disc but no true nerve root impingement.     Past medical history, social, surgical and family history all reviewed in electronic medical record.   Review of Systems: No headache, visual changes, nausea, vomiting, diarrhea, constipation, dizziness, abdominal pain, skin rash, fevers, chills, night sweats, weight loss, swollen lymph nodes, body aches, joint swelling, muscle aches, chest pain, shortness of breath, mood changes.   Objective Blood pressure 116/78, pulse 76, height  (1.727 m), weight 260 lb (117.935 kg), SpO2 98 %.  General: No apparent distress alert and oriented x3 mood and affect  normal, dressed appropriately.  HEENT: Pupils equal, extraocular movements intact  Respiratory: Patient's speak in full sentences and does not appear short of breath  Cardiovascular: No lower extremity edema, non tender, no erythema  Skin: Warm dry intact with no signs of infection or rash on extremities or on axial skeleton.  Abdomen: Soft nontender  Neuro: Cranial nerves II through XII are intact, neurovascularly intact in all extremities with 2+ DTRs and 2+ pulses.  Lymph: No lymphadenopathy of posterior or anterior cervical chain or axillae bilaterally.  Gait normal with good balance and coordination.  MSK:  Non tender with full range of motion and good stability and symmetric strength and tone of shoulders, wrist, hip, knee and ankles bilaterally.  Back Exam:  Inspection: Unremarkable  Motion: Flexion 35 deg, Extension 30 deg with no pain which is an improvement, Side Bending to 35 deg bilaterally,  Rotation to 35 deg bilaterally  SLR laying: Negative XSLR laying: Negative Palpable tenderness: Nontender FABER: Negative is an improvement Sensory change: Gross sensation intact to all lumbar and sacral dermatomes.  Reflexes: 2+ at both patellar tendons, 2+ at achilles tendons, Babinski's downgoing.  Strength at foot  Plantar-flexion: 5/5 Dorsi-flexion: 5/5 Eversion: 5/5 Inversion: 5/5  Leg strength  Quad: 5/5 Hamstring: 5/5 Hip flexor: 5/5 Hip abductors: 4/5  Gait unremarkable.  Neck: Inspection unremarkable. No palpable stepoffs. Positive Spurling's maneuver with radicular symptoms down both arms to the middle finger.. Full neck range of motion Grip strength and sensation normal in bilateral hands Strength good C4 to T1 distribution No sensory change to C4 to T1 Negative Hoffman sign bilaterally Reflexes  normal  Osteopathic findings Lumbar L2 flexed rotated inside that right Sacrum Left on left Thoracic T5 extended rotated and side bent right     Impression and  Recommendations:     This case required medical decision making of moderate complexity.

## 2015-02-18 NOTE — Patient Instructions (Addendum)
Good to see you Brandy Bryant is your friend Venetia MaxonStern for C4-5 osteophyte and questionable fusion.  Call me or write me for any questions Gabapentin 100/100 and 600mg 

## 2015-02-18 NOTE — Assessment & Plan Note (Signed)
Patient's MRI does show that patient has osteophyte formation between the 2 fusions. I think that this is likely contributing to most of her pain especially when going towards her elbow. Patient's quality of life has decreased considerably. Patient having 2 different fusions I do not feel safe with ordering an epidural until patient is seen by her surgeon. Patient will discuss this with her surgeon versus the possibility of more surgical intervention. We discussed continuing the other conservative therapies including the over-the-counter medications as well as patient given refill of medications a seem to be helping. Patient will increase her gabapentin 200 mg in the morning as well as afternoon in 6 mg at night. We may need to increases again at a later date. Regarding her neck palpation Main follow-up with her surgeon and will follow-up with me on an as-needed basis.

## 2015-02-18 NOTE — Assessment & Plan Note (Signed)
Decision today to treat with OMT was based on Physical Exam  After verbal consent patient was treated with HVLA, ME,  techniques in thoracic, lumbar and sacral areas  Patient tolerated the procedure well with improvement in symptoms  Patient given exercises, stretches and lifestyle modifications  See medications in patient instructions if given  Patient will follow up in 4 weeks  

## 2015-02-18 NOTE — Progress Notes (Signed)
Pre visit review using our clinic review tool, if applicable. No additional management support is needed unless otherwise documented below in the visit note. 

## 2015-02-20 ENCOUNTER — Encounter: Payer: Self-pay | Admitting: Family Medicine

## 2015-08-20 ENCOUNTER — Other Ambulatory Visit: Payer: Self-pay | Admitting: Obstetrics and Gynecology

## 2015-08-20 DIAGNOSIS — Z1231 Encounter for screening mammogram for malignant neoplasm of breast: Secondary | ICD-10-CM

## 2015-08-20 DIAGNOSIS — N6320 Unspecified lump in the left breast, unspecified quadrant: Principal | ICD-10-CM

## 2015-08-20 DIAGNOSIS — N6325 Unspecified lump in the left breast, overlapping quadrants: Secondary | ICD-10-CM

## 2015-08-21 ENCOUNTER — Ambulatory Visit: Payer: No Typology Code available for payment source

## 2015-08-27 ENCOUNTER — Ambulatory Visit: Payer: No Typology Code available for payment source

## 2015-08-29 ENCOUNTER — Ambulatory Visit
Admission: RE | Admit: 2015-08-29 | Discharge: 2015-08-29 | Disposition: A | Discharge status: Home / Self Care | Payer: Managed Care, Other (non HMO) | Source: Ambulatory Visit | Attending: Obstetrics and Gynecology | Admitting: Obstetrics and Gynecology

## 2015-08-29 DIAGNOSIS — N6320 Unspecified lump in the left breast, unspecified quadrant: Secondary | ICD-10-CM

## 2015-08-29 DIAGNOSIS — N63 Unspecified lump in breast: Secondary | ICD-10-CM | POA: Insufficient documentation

## 2015-08-29 DIAGNOSIS — N6325 Unspecified lump in the left breast, overlapping quadrants: Secondary | ICD-10-CM

## 2015-09-04 ENCOUNTER — Encounter: Payer: Self-pay | Admitting: Physician Assistant

## 2015-09-04 ENCOUNTER — Ambulatory Visit: Payer: Self-pay | Admitting: Physician Assistant

## 2015-09-04 VITALS — BP 110/70 | HR 84 | Temp 98.2°F

## 2015-09-04 DIAGNOSIS — J018 Other acute sinusitis: Secondary | ICD-10-CM

## 2015-09-04 MED ORDER — FLUCONAZOLE 150 MG PO TABS: ORAL_TABLET | ORAL | Status: DC

## 2015-09-04 MED ORDER — CEFDINIR 300 MG PO CAPS: 300.0000 mg | ORAL_CAPSULE | Freq: Two times a day (BID) | ORAL | Status: DC

## 2015-09-04 NOTE — Progress Notes (Signed)
S: C/o runny nose and congestion for 3 days, no fever, chills, cp/sob, v/d; mucus is green and thick, cough is sporadic, c/o of facial and dental pressure, throat is irritated from drainage.   Using otc meds:   O: PE: vitals wnl, nad, perrl eomi, normocephalic, tms dull, nasal mucosa red and swollen, throat injected, neck supple no lymph, lungs c t a, cv rrr, neuro intact  A:  Acute sinusitis   P: omnicef , diflucan, drink fluids, continue regular meds , use otc meds of choice, return if not improving in 5 days, return earlier if worsening

## 2015-10-18 ENCOUNTER — Encounter: Payer: Self-pay | Admitting: Physician Assistant

## 2015-10-18 ENCOUNTER — Ambulatory Visit: Payer: Self-pay | Admitting: Physician Assistant

## 2015-10-18 VITALS — BP 110/70 | HR 80 | Temp 98.6°F

## 2015-10-18 DIAGNOSIS — N390 Urinary tract infection, site not specified: Secondary | ICD-10-CM

## 2015-10-18 LAB — POCT URINALYSIS DIPSTICK
Bilirubin, UA: NEGATIVE
KETONES UA: NEGATIVE
Nitrite, UA: NEGATIVE
PROTEIN UA: NEGATIVE
Spec Grav, UA: 1.015
Urobilinogen, UA: 0.2
pH, UA: 7

## 2015-10-18 MED ORDER — PROMETHAZINE HCL 25 MG PO TABS: 25.0000 mg | ORAL_TABLET | Freq: Three times a day (TID) | ORAL | Status: DC | PRN

## 2015-10-18 MED ORDER — CIPROFLOXACIN HCL 250 MG PO TABS: 250.0000 mg | ORAL_TABLET | Freq: Two times a day (BID) | ORAL | Status: DC

## 2015-10-18 MED ORDER — FLUCONAZOLE 150 MG PO TABS: ORAL_TABLET | ORAL | Status: DC

## 2015-10-18 NOTE — Progress Notes (Signed)
S:  C/o uti sx for 1 days, burning, urgency, frequency, denies vaginal discharge, abdominal pain or flank pain: some nausea Remainder ros neg  O:  Vitals wnl, nad, no cva tenderness, back nontender, lungs c t a,cv rrr, abd soft nontender, bs normal, n/v intact  A: uti  P: cipro  bid x 7d, phenergan prn n/, diflucan ; increase water intake, add cranberry juice, return if not improving in 2 -3 days, return earlier if worsening, discussed pyelonephritis sx

## 2016-03-21 DIAGNOSIS — F419 Anxiety disorder, unspecified: Secondary | ICD-10-CM | POA: Insufficient documentation

## 2016-06-21 IMAGING — MG MM ADDITIONAL VIEWS AT NO CHARGE
2 series · 2 of 2 positions shown · non-contrast
Comparison: Priors

CLINICAL DATA: Screening callback for questioned right breast mass

EXAM:
DIGITAL DIAGNOSTIC  right MAMMOGRAM

[R MLO]
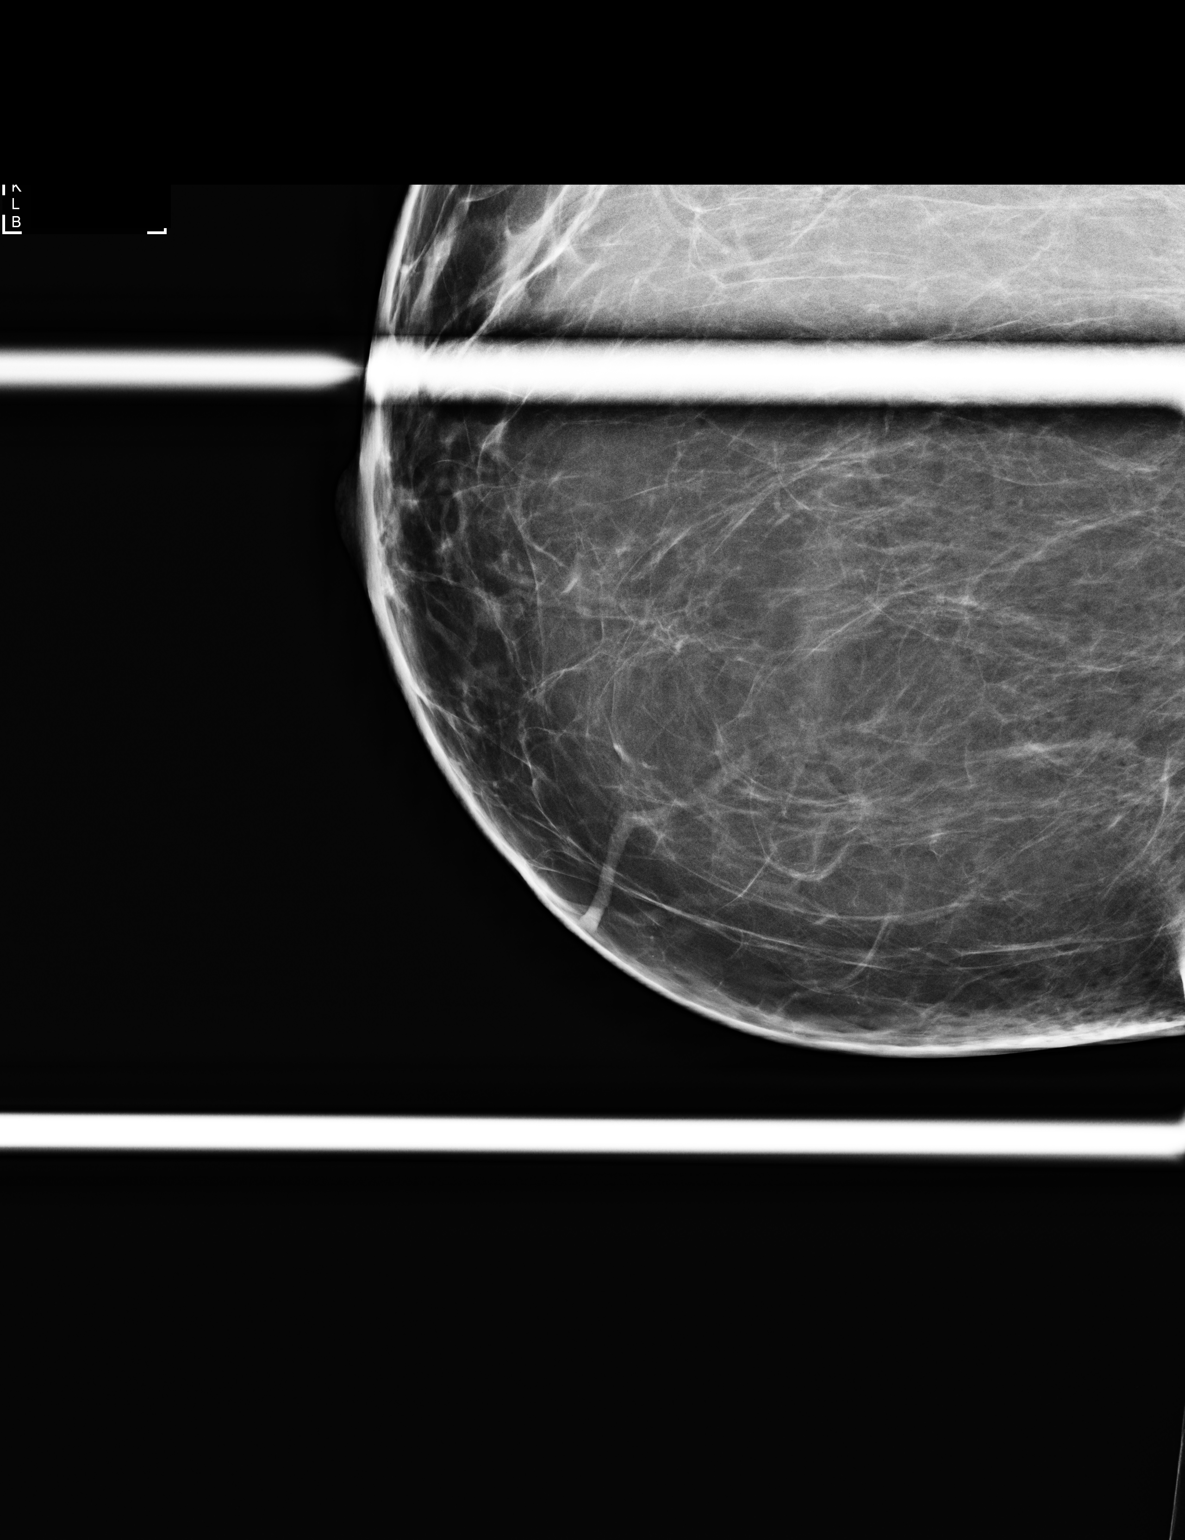

[R CC]
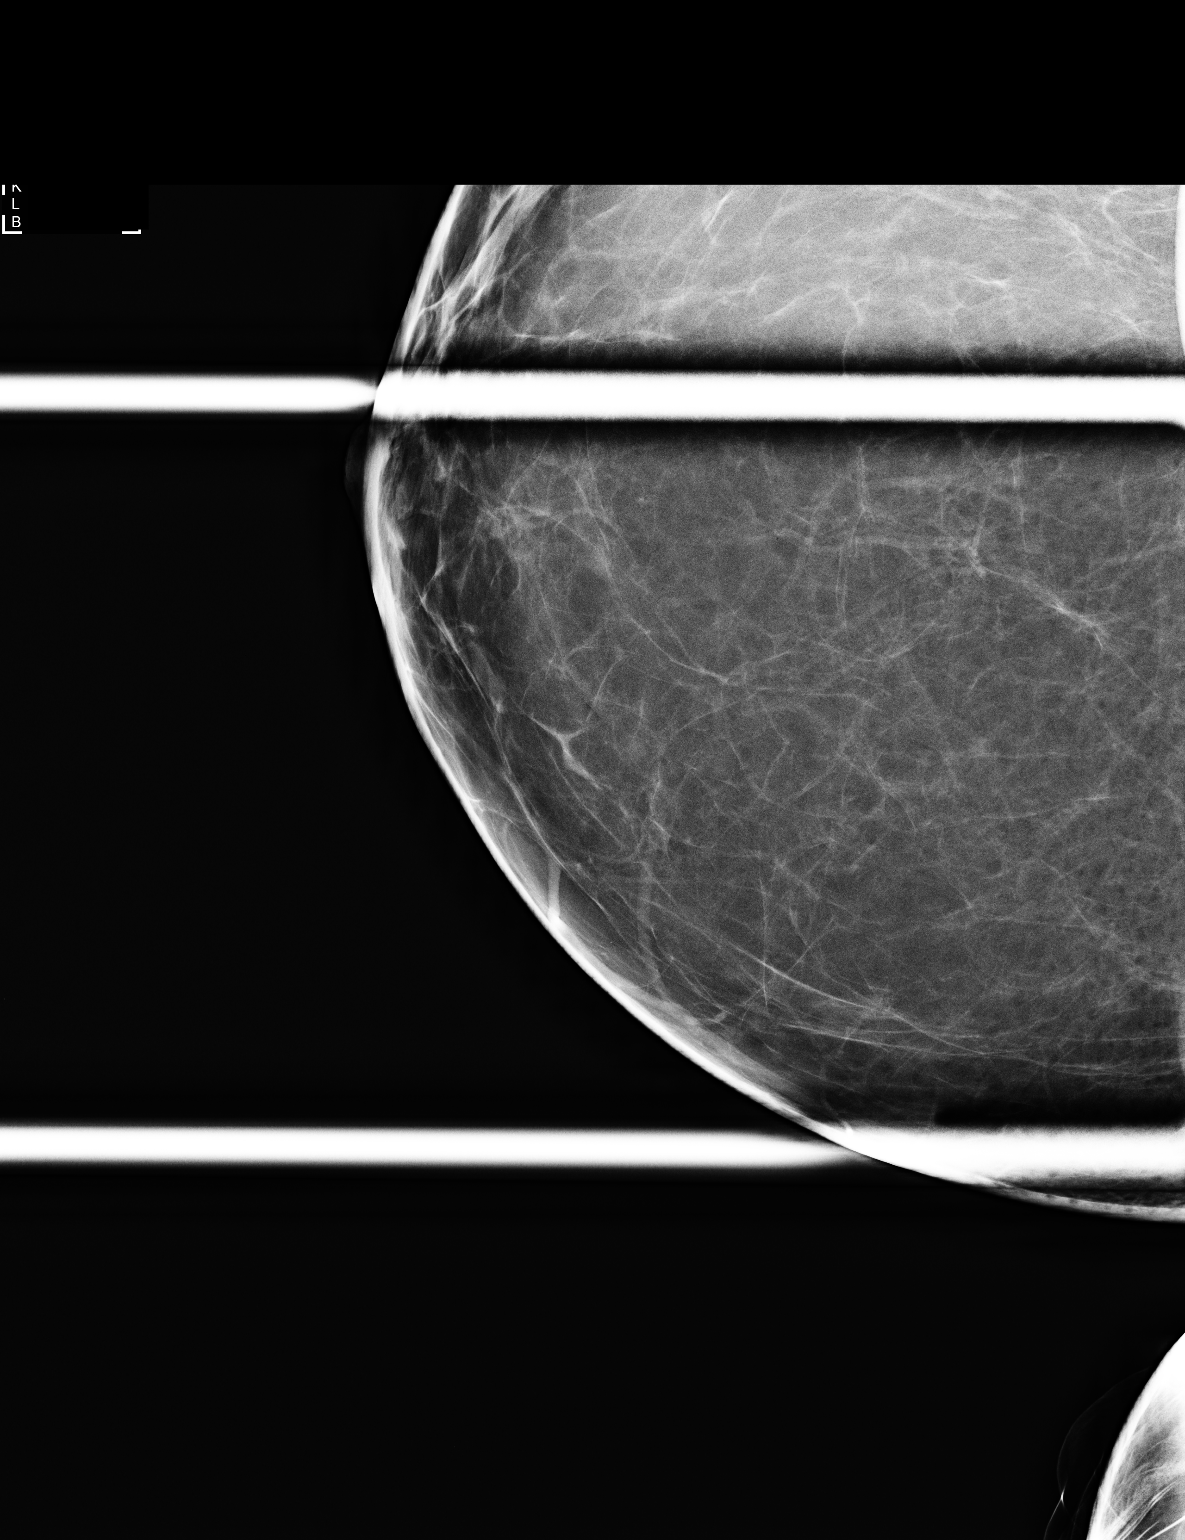

[2 of 2 positions shown; findings below may reference images not displayed]

ACR Breast Density Category b: There are scattered areas of
fibroglandular density.
FINDINGS: No persistent abnormality is identified in the right breast.
IMPRESSION: No evidence for malignancy in the right breast.

RECOMMENDATION:
Screening mammogram in one year.(Code:[id number]-[id number])

I have discussed the findings and recommendations with the patient.
Results were also provided in writing at the conclusion of the
visit. If applicable, a reminder letter will be sent to the patient
regarding the next appointment.

BI-RADS CATEGORY  1: Negative.

## 2016-08-20 ENCOUNTER — Other Ambulatory Visit: Payer: Self-pay | Admitting: Obstetrics and Gynecology

## 2016-08-20 DIAGNOSIS — Z1231 Encounter for screening mammogram for malignant neoplasm of breast: Secondary | ICD-10-CM

## 2016-09-17 ENCOUNTER — Ambulatory Visit
Admission: RE | Admit: 2016-09-17 | Discharge: 2016-09-17 | Disposition: A | Discharge status: Home / Self Care | Payer: Managed Care, Other (non HMO) | Source: Ambulatory Visit | Attending: Obstetrics and Gynecology | Admitting: Obstetrics and Gynecology

## 2016-09-17 DIAGNOSIS — Z1231 Encounter for screening mammogram for malignant neoplasm of breast: Secondary | ICD-10-CM | POA: Diagnosis present

## 2016-10-19 ENCOUNTER — Encounter: Payer: Self-pay | Admitting: Physician Assistant

## 2016-10-19 ENCOUNTER — Ambulatory Visit: Payer: Self-pay | Admitting: Physician Assistant

## 2016-10-19 VITALS — BP 110/70 | HR 96 | Temp 98.4°F

## 2016-10-19 DIAGNOSIS — N390 Urinary tract infection, site not specified: Secondary | ICD-10-CM

## 2016-10-19 LAB — POCT URINALYSIS DIPSTICK
Bilirubin, UA: NEGATIVE
Ketones, UA: NEGATIVE
NITRITE UA: NEGATIVE
PH UA: 8.5
PROTEIN UA: NEGATIVE
RBC UA: NEGATIVE
Spec Grav, UA: 1.015
UROBILINOGEN UA: 0.2

## 2016-10-19 MED ORDER — FLUCONAZOLE 150 MG PO TABS: ORAL_TABLET | ORAL | 0 refills | Status: DC

## 2016-10-19 MED ORDER — CIPROFLOXACIN HCL 250 MG PO TABS: 250.0000 mg | ORAL_TABLET | Freq: Two times a day (BID) | ORAL | 0 refills | Status: DC

## 2016-10-19 NOTE — Progress Notes (Signed)
S:  C/o uti sx for 2 days, burning, urgency, frequency, pressure;  denies fever, vaginal discharge, abdominal pain or flank pain:  Remainder ros neg, had a uti in November and one in December, ?if she should get checked for std  O:  Vitals wnl, nad, no cva tenderness, back nontender, lungs c t a,cv rrr, abd soft nontender, bs normal, n/v intact, ua +1 leuks  A: uti  P: cipro 250mg  bid x 7d, diflucan, increase water intake, add cranberry juice, return if not improving in 2 -3 days, return earlier if worsening, discussed pyelonephritis sx, f/u with gyn for std testing, practice safe sex

## 2016-10-21 LAB — URINE CULTURE

## 2016-10-29 ENCOUNTER — Ambulatory Visit: Payer: Self-pay | Admitting: Physician Assistant

## 2016-10-29 ENCOUNTER — Encounter: Payer: Self-pay | Admitting: Physician Assistant

## 2016-10-29 VITALS — BP 122/80 | HR 80 | Temp 98.3°F

## 2016-10-29 DIAGNOSIS — N390 Urinary tract infection, site not specified: Secondary | ICD-10-CM

## 2016-10-29 LAB — POCT URINALYSIS DIPSTICK
Bilirubin, UA: NEGATIVE
Glucose, UA: NEGATIVE
Ketones, UA: NEGATIVE
Nitrite, UA: NEGATIVE
PROTEIN UA: NEGATIVE
SPEC GRAV UA: 1.015
UROBILINOGEN UA: 0.2
pH, UA: 7

## 2016-10-29 MED ORDER — PHENAZOPYRIDINE HCL 200 MG PO TABS: 200.0000 mg | ORAL_TABLET | Freq: Three times a day (TID) | ORAL | 0 refills | Status: DC | PRN

## 2016-10-29 MED ORDER — SULFAMETHOXAZOLE-TRIMETHOPRIM 800-160 MG PO TABS: 1.0000 | ORAL_TABLET | Freq: Two times a day (BID) | ORAL | 0 refills | Status: DC

## 2016-10-29 MED ORDER — FLUCONAZOLE 150 MG PO TABS: ORAL_TABLET | ORAL | 0 refills | Status: DC

## 2016-10-29 NOTE — Progress Notes (Signed)
S:  C/o uti sx for 2 days, burning, urgency, frequency, states she got better then the sx returned, some yeast from previous antibiotic also, pt is diabetic;  denies vaginal discharge, abdominal pain or flank pain:  Remainder ros neg  O:  Vitals wnl, nad, no cva tenderness, back nontender, lungs c t a,cv rrr, abd soft nontender, bs normal, n/v intact , 3+ leuks  A: uti  P: septra ds po bid x 7d, diflucan, pyridium,  increase water intake, add cranberry juice, return if not improving in 2 -3 days, return earlier if worsening, discussed pyelonephritis sx

## 2016-12-01 ENCOUNTER — Ambulatory Visit: Payer: Self-pay | Admitting: Physician Assistant

## 2016-12-01 VITALS — BP 119/69 | HR 102 | Temp 97.9°F

## 2016-12-01 DIAGNOSIS — R319 Hematuria, unspecified: Principal | ICD-10-CM

## 2016-12-01 DIAGNOSIS — N390 Urinary tract infection, site not specified: Secondary | ICD-10-CM

## 2016-12-01 LAB — POCT URINALYSIS DIPSTICK
BILIRUBIN UA: NEGATIVE
Ketones, UA: NEGATIVE
NITRITE UA: NEGATIVE
Protein, UA: NEGATIVE
SPEC GRAV UA: 1.01 (ref 1.030–1.035)
Urobilinogen, UA: 0.2 (ref ?–2.0)
pH, UA: 5.5 (ref 5.0–8.0)

## 2016-12-01 MED ORDER — NITROFURANTOIN MACROCRYSTAL 50 MG PO CAPS: ORAL_CAPSULE | ORAL | 2 refills | Status: DC

## 2016-12-01 MED ORDER — CIPROFLOXACIN HCL 250 MG PO TABS: 250.0000 mg | ORAL_TABLET | Freq: Two times a day (BID) | ORAL | 0 refills | Status: DC

## 2016-12-01 MED ORDER — FLUCONAZOLE 150 MG PO TABS: ORAL_TABLET | ORAL | 0 refills | Status: DC

## 2016-12-01 NOTE — Progress Notes (Signed)
S:  C/o uti sx for 2 days, burning, urgency, frequency, denies vaginal discharge, abdominal pain or flank pain: has had several, happens after having sex;  Remainder ros neg  O:  Vitals wnl, nad, no cva tenderness, back nontender, lungs c t a,cv rrr, abd soft nontender, bs normal, n/v intact  A: uti  P: cipro  bid x 7d, increase water intake, add cranberry juice, return if not improving in 2 -3 days, return earlier if worsening, discussed pyelonephritis sx; discussed "clean sex" habits; rx for macrodantin  after sexual activity

## 2017-04-23 DIAGNOSIS — K5720 Diverticulitis of large intestine with perforation and abscess without bleeding: Secondary | ICD-10-CM | POA: Insufficient documentation

## 2017-04-24 DIAGNOSIS — R102 Pelvic and perineal pain unspecified side: Secondary | ICD-10-CM | POA: Diagnosis present

## 2017-04-24 DIAGNOSIS — Z862 Personal history of diseases of the blood and blood-forming organs and certain disorders involving the immune mechanism: Secondary | ICD-10-CM | POA: Insufficient documentation

## 2017-04-24 DIAGNOSIS — B019 Varicella without complication: Secondary | ICD-10-CM | POA: Insufficient documentation

## 2017-05-26 ENCOUNTER — Encounter
Admission: RE | Admit: 2017-05-26 | Discharge: 2017-05-26 | Disposition: A | Discharge status: Home / Self Care | Payer: Managed Care, Other (non HMO) | Source: Ambulatory Visit | Attending: Obstetrics and Gynecology | Admitting: Obstetrics and Gynecology

## 2017-05-26 DIAGNOSIS — Z01818 Encounter for other preprocedural examination: Secondary | ICD-10-CM | POA: Insufficient documentation

## 2017-05-26 DIAGNOSIS — R102 Pelvic and perineal pain: Secondary | ICD-10-CM | POA: Insufficient documentation

## 2017-05-26 HISTORY — DX: Sleep apnea, unspecified: G47.30

## 2017-05-26 LAB — CBC
HCT: 43.6 % (ref 35.0–47.0)
HEMOGLOBIN: 14.7 g/dL (ref 12.0–16.0)
MCH: 30.3 pg (ref 26.0–34.0)
MCHC: 33.8 g/dL (ref 32.0–36.0)
MCV: 89.8 fL (ref 80.0–100.0)
Platelets: 263 10*3/uL (ref 150–440)
RBC: 4.85 MIL/uL (ref 3.80–5.20)
RDW: 13.7 % (ref 11.5–14.5)
WBC: 8 10*3/uL (ref 3.6–11.0)

## 2017-05-26 LAB — BASIC METABOLIC PANEL
Anion gap: 7 (ref 5–15)
BUN: 11 mg/dL (ref 6–20)
CALCIUM: 9.3 mg/dL (ref 8.9–10.3)
CO2: 30 mmol/L (ref 22–32)
CREATININE: 0.62 mg/dL (ref 0.44–1.00)
Chloride: 102 mmol/L (ref 101–111)
GFR calc Af Amer: >60 mL/min (ref >60)
GFR calc non Af Amer: >60 mL/min (ref >60)
GLUCOSE: 86 mg/dL (ref 65–99)
Potassium: 4 mmol/L (ref 3.5–5.1)
Sodium: 139 mmol/L (ref 135–145)

## 2017-05-26 LAB — TYPE AND SCREEN
ABO/RH(D): AB POS
Antibody Screen: NEGATIVE

## 2017-05-26 LAB — CHLAMYDIA/NGC RT PCR (ARMC ONLY)
CHLAMYDIA TR: NOT DETECTED
N gonorrhoeae: NOT DETECTED

## 2017-05-26 NOTE — Pre-Procedure Instructions (Signed)
EKG NOTE-ECG 12 Lead8/31/2018 [facility] Component Name Value Ref Range  EKG Systolic BP  mmHg  EKG Diastolic BP  mmHg  EKG Ventricular Rate 78 BPM  EKG Atrial Rate 78 BPM  EKG P-R Interval 118 ms  EKG QRS Duration 88 ms  EKG Q-T Interval 410 ms  EKG QTC Calculation 467 ms  EKG Calculated P Axis 43 degrees  EKG Calculated R Axis 57 degrees  EKG Calculated T Axis 56 degrees  Result Narrative  NORMAL SINUS RHYTHM BASELINE WANDER NON-SPECIFIC ST/T WAVE CHANGES BORDERLINE ECG NO PREVIOUS ECGS AVAILABLE Confirmed by MILLERMD, PAULA ([id number]) on 04/23/2017 4:18:17 PM  Other Result Information  Interface, Rad Results In - 04/23/2017  4:18 PM EDT NORMAL SINUS RHYTHM BASELINE WANDER NON-SPECIFIC ST/T WAVE CHANGES BORDERLINE ECG NO PREVIOUS ECGS AVAILABLE Confirmed by MILLER  MD, PAULA ([id number]) on 04/23/2017 4:18:17 PM  Status Results Details

## 2017-05-26 NOTE — H&P (Signed)
Brandy Bryant is a 46 y.o. female here for Pre Op Consulting . 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 ES 28mm  s/p endometrial ablation and BTL. Started having hot flashes at 46yo, mostly at night. s/p smoking.  Recently admitted with diverticulitis, and this pain is different. She has had some diarrhea over last 3 days, but no bloating or constipation.  Treated for trich earlier this year with neg TOC. Ultrasound earlier found fluid in uterine cavity, but because of cervical stenosis failed EMBx   Past Medical History:  has a past medical history of Chickenpox; Depression; Diabetes mellitus type 1 (CMS-HCC); Diabetes mellitus type 2, uncomplicated (CMS-HCC); Former smoker; GERD (gastroesophageal reflux disease); H/O gastric bypass; History of iron deficiency anemia; Hyperlipidemia; Hypertension; Sleep apnea; and Thyroid disease.  Past Surgical History:  has a past surgical history that includes Tubal ligation; Endometrial ablation; other surgery (2009); other surgery (2007); Septoplasty; other surgery; other surgery (Right, 01/2012); Cholecystectomy (2014); other surgery (2014); other surgery (2014); egd (08/08/2010); Spine surgery (Sept 2014); and Hernia repair (06/20/14). Family History: family history includes Anxiety in her maternal aunt and paternal grandmother; Cancer in her maternal aunt and maternal grandfather; Clotting disorder in her maternal grandfather and mother; Colon polyps in her father and mother; Coronary Artery Disease (Blocked arteries around heart) in her maternal grandfather, mother, and paternal grandfather; Dementia in her paternal grandmother; Depression in her maternal aunt and mother; Diabetes in her maternal grandfather, maternal grandmother, paternal grandfather, and paternal grandmother; Gallbladder disease in her father; High blood pressure (Hypertension) in her father and paternal grandfather; Hyperlipidemia (Elevated  cholesterol) in her father and mother; Kidney disease in her maternal grandmother; Myocardial Infarction (Heart attack) in her maternal grandfather; Obesity in her father; Osteopenia in her mother; Raynaud syndrome in her mother; Reflux disease in her mother; Skin cancer in her paternal grandfather; Stroke in her maternal grandfather and paternal grandfather; Thyroid disease in her mother. Social History:  reports that she quit smoking about 18 years ago. She has a 30.00 pack-year smoking history. She has never used smokeless tobacco. She reports that she does not drink alcohol or use drugs. OB/GYN History:          OB History    Gravida Para Term Preterm AB Living   2       2 0   SAB TAB Ectopic Molar Multiple Live Births                    Allergies: is allergic to vancomycin; adhesive tape-silicones; codeine; oxycodone; penicillins; and valium [diazepam]. Medications:  Current Outpatient Prescriptions:  .  BIOTIN ORAL, Take 1 tablet by mouth once daily., Disp: , Rfl:  .  buPROPion (WELLBUTRIN XL) 300 MG XL tablet, Take 1 tablet (300 mg total) by mouth once daily. MUST MAKE APPT FOR ANY FURTHER REFILLS., Disp: 30 tablet, Rfl: 2 .  calcium carbonate-vitamin D3 (OS-CAL 500+D) 500 mg(1,250mg ) -400 unit tablet, Take 1 tablet by mouth once daily., Disp: , Rfl:  .  cyanocobalamin (VITAMIN B12) 1000 MCG tablet, Take 1,000 mcg by mouth once daily., Disp: , Rfl:  .  doxycycline (ORACEA) 40 mg capsule, Take 40 mg by mouth every morning., Disp: , Rfl:  .  HYDROcodone-acetaminophen (NORCO) 5-325 mg tablet, Take by mouth. Reported on 08/20/2015 , Disp: , Rfl:  .  insulin LISPRO (HUMALOG) 100 unit/mL injection, Inject subcutaneously once daily. Continues through insulin pump, Disp: , Rfl:  .  lamoTRIgine (LAMICTAL) 25  MG tablet, Take 2 tablets by mouth twice daily, Disp: 120 tablet, Rfl: 11 .  losartan (COZAAR) 25 MG tablet, TAKE 1/2 TABLET BY MOUTH ONCE DAILY, Disp: 15 tablet, Rfl: 10 .   meloxicam (MOBIC) 15 MG tablet, Take 1 tablet (15 mg total) by mouth once daily., Disp: 30 tablet, Rfl: 1 .  metaxalone (SKELAXIN) 800 mg tablet, Take 1 tablet (800 mg total) by mouth 3 (three) times daily as needed for Pain., Disp: 90 tablet, Rfl: 2 .  methylphenidate HCl (RITALIN) 10 MG tablet, Take 1 tablet (10 mg total) by mouth 2 (two) times daily., Disp: 60 tablet, Rfl: 0 .  methylphenidate HCl (RITALIN) 10 MG tablet, Take 1 tablet (10 mg total) by mouth 2 (two) times daily. Earliest Fill Date: 06/07/16, Disp: 60 tablet, Rfl: 0 .  methylphenidate HCl (RITALIN) 10 MG tablet, Take 1 tablet (10 mg total) by mouth 2 (two) times daily. Earliest Fill Date: 07/08/16, Disp: 60 tablet, Rfl: 0 .  MULTIVITAMIN ORAL, Take 1 tablet by mouth once daily., Disp: , Rfl:  .  naproxen (NAPROSYN) 500 MG tablet, Take 1 tablet (500 mg total) by mouth 2 (two) times daily with meals for 15 days., Disp: 30 tablet, Rfl: 0 .  oxyCODONE (ROXICODONE) 5 MG immediate release tablet, Take 1 tablet (5 mg total) by mouth every 6 (six) hours as needed for Pain., Disp: 12 tablet, Rfl: 0 .  oxyCODONE-acetaminophen (PERCOCET) 5-325 mg tablet, Take 1 tablet by mouth every 6 (six) hours as needed for Pain., Disp: 10 tablet, Rfl: 0 .  pravastatin (PRAVACHOL) 80 MG tablet, Take 1 tablet (80 mg total) by mouth nightly., Disp: 30 tablet, Rfl: 11 .  promethazine (PHENERGAN) 25 MG tablet, Take by mouth., Disp: , Rfl:  .  valACYclovir (VALTREX) 500 MG tablet, Take 1 tablet (500 mg total) by mouth once daily. Suppressive Therapy, Disp: 90 tablet, Rfl: 3  Review of Systems: No SOB, no palpitations or chest pain, no new lower extremity edema, no nausea or vomiting or bowel or bladder complaints. See HPI for gyn specific ROS.    Exam:      Vitals:   05/26/17 0956  BP: 118/72  Pulse: 87   Body mass index is 38.01 kg/m.  General: Well-developed, well-nourished  female in no acute distress Body mass index is 38.01 kg/m. Skin:  No rashes, ulcers or skin lesions noted. No excessive hirsutism or acne noted.  Neurological: Appears alert and oriented and is a good historian. No gross abnormalities are noted. Psychological: Normal affect and mood. No signs of anxiety or depression noted.  Pelvic exam: Pelvic:              External genitalia: vulva and labia without lesions, Tanner stage 5             Urethra: no prolapse             Vagina: normal physiologic d/c             Cervix: no lesions, mild cervical motion tenderness               Uterus: normal size shape and contour, significantly tender             Adnexa: no mass,  bilaterally-tender               Rectovaginal: external exam normal  Carnett's negative Normal stools, some diarrhea recently  Impression:   The primary encounter diagnosis was Pelvic pain in female. A diagnosis  of Excessive or frequent menstruation was also pertinent to this visit.    Plan:   - Postablation tubal sterilization syndrome - Some women who have undergone tubal ligation prior to endometrial ablation experience cyclic or intermittent pelvic pain.   -  Post ablation tubal sterilization syndrome (PATSS): Up to 10% of women who have undergone tubal ligation and endometrial ablation experience cyclic or intermittent pelvic pain. The etiology is unknown, but may be from active endometrium that is trapped in the uterine cornua or uterine contracture and intrauterine scarring. The dx is clinical; ultrasound is not reliable at making the dx and it is confirmed surgically. Definitive treatment is hysterectomy.  Pt aware that without embx, this stripe is likely benign and related to PATSS, but that is malignancy were found, referral to gyn onc likely.   Patient returns for a preoperative discussion regarding her plans to proceed with surgical treatment of her pelvic pain by total laparoscopic hysterectomy with bilateral salpingectomy  procedure. We will perform a cystoscopy to  evaluate the urinary tract after the procedure.   The patient and I discussed the technical aspects of the procedure including the potential for risks and complications. These include but are not limited to the risk of infection requiring post-operative antibiotics or further procedures. We talked about the risk of injury to adjacent organs including bladder, bowel, ureter, blood vessels or nerves. We talked about the need to convert to an open incision. We talked about the possible need for blood transfusion. We talked aboutpostop complications such asthromboembolic or cardiopulmonary complications. All of her questions were answered.  Her preoperative exam was completed and the appropriate consents were signed. She is scheduled to undergo this procedure in the near future.  Specific Peri-operative Considerations:  - Consent: obtained today - Health Maintenance: up to date - Labs: CBC, CMP preoperatively - Studies: EKG, CXR preoperatively - Bowel Preparation: None required - Abx:  Cefoxitin 2g - VTE ppx: SCDs perioperatively - Glucose Protocol: no- will continue pump during surgery - Beta-blockade: n/a  Will give 12 more tabs oxycodone to manage until Monday  Return in about 2 weeks (around 06/09/2017) for Postop check.  Cecilie Kicks, MD

## 2017-05-26 NOTE — Patient Instructions (Signed)
Your procedure is scheduled on: Monday May 31, 2017 Report to Same Day Surgery on the 2nd floor in the [facility]. To find out your arrival time, please call [phone] between 1PM - 3PM on: May 28, 2017   REMEMBER: Instructions that are not followed completely may result in serious medical risk up to and including death; or upon the discretion of your surgeon and anesthesiologist your surgery may need to be rescheduled.  Do not eat food or drink liquids after midnight. No gum chewing or hard candies.  Type 1 and Type 2 diabetics should only drink water.  No Alcohol for 24 hours before or after surgery.  No Smoking for 24 hours prior to surgery.  Notify your doctor if there is any change in your medical condition (cold, fever, infection).  Do not wear jewelry, make-up, hairpins, clips or nail polish.  Do not wear lotions, powders, or perfumes.   Do not shave 48 hours prior to surgery. Men may shave face and neck.  Contacts and dentures may not be worn into surgery.  Do not bring valuables to the hospital. [facility] is not responsible for any belongings or valuables.   TAKE THESE MEDICATIONS THE MORNING OF SURGERY WITH A SIP OF WATER: DOXYCLYCLINE WELLBUTRIN SYNTHROID  PREVASTATIN ZOLOFT VALTREX   Use CHG Soap or wipes as directed on instruction sheet.  Bring your C-PAP to the hospital with you in case you may have to spend the night.  Stop Anti-inflammatories such as Advil, Aleve, Ibuprofen, Motrin, Naproxen, Naprosyn, Goodie powder, or aspirin products. (May take Tylenol or Acetaminophen and Celebrex if needed.)  Stop BIOTIN supplements until after surgery. (May continue Vitamin D, Vitamin B, and multivitamin.)  If you are being admitted to the hospital overnight, leave your suitcase in the car. After surgery it may be brought to your room.  If you are being discharged the day of surgery, you will not be allowed to drive home. You will need someone to  drive you home and stay with you that night.   If you are taking public transportation, you will need to have a responsible adult to with you.  Please call the number above if you have any questions about these instructions.

## 2017-05-27 LAB — RPR: RPR: NONREACTIVE

## 2017-05-30 MED ORDER — GENTAMICIN SULFATE 40 MG/ML IJ SOLN
420.0000 mg | INTRAVENOUS | Status: AC
  Administered 2017-05-31: 420 mg via INTRAVENOUS
  Filled 2017-05-30: qty 10.5

## 2017-05-30 MED ORDER — CLINDAMYCIN PHOSPHATE 900 MG/50ML IV SOLN
900.0000 mg | INTRAVENOUS | Status: AC
Start: 2017-05-31 — End: 2017-05-31
  Administered 2017-05-31: 900 mg via INTRAVENOUS

## 2017-05-30 MED ORDER — GENTAMICIN SULFATE 40 MG/ML IJ SOLN
INTRAMUSCULAR | Status: DC
  Filled 2017-05-30: qty 10.5

## 2017-05-31 ENCOUNTER — Ambulatory Visit: Payer: Managed Care, Other (non HMO) | Admitting: Certified Registered Nurse Anesthetist

## 2017-05-31 ENCOUNTER — Observation Stay
Admission: RE | Admit: 2017-05-31 | Discharge: 2017-06-01 | Disposition: A | Discharge status: Home / Self Care | Payer: Managed Care, Other (non HMO) | Source: Ambulatory Visit | Attending: Obstetrics and Gynecology | Admitting: Obstetrics and Gynecology

## 2017-05-31 ENCOUNTER — Encounter
Admission: RE | Disposition: A | Discharge status: Home / Self Care | Payer: Self-pay | Source: Ambulatory Visit | Attending: Obstetrics and Gynecology

## 2017-05-31 DIAGNOSIS — E1151 Type 2 diabetes mellitus with diabetic peripheral angiopathy without gangrene: Secondary | ICD-10-CM | POA: Insufficient documentation

## 2017-05-31 DIAGNOSIS — Z23 Encounter for immunization: Secondary | ICD-10-CM | POA: Diagnosis not present

## 2017-05-31 DIAGNOSIS — Z791 Long term (current) use of non-steroidal anti-inflammatories (NSAID): Secondary | ICD-10-CM | POA: Insufficient documentation

## 2017-05-31 DIAGNOSIS — Z79899 Other long term (current) drug therapy: Secondary | ICD-10-CM | POA: Diagnosis not present

## 2017-05-31 DIAGNOSIS — K219 Gastro-esophageal reflux disease without esophagitis: Secondary | ICD-10-CM | POA: Insufficient documentation

## 2017-05-31 DIAGNOSIS — Z9851 Tubal ligation status: Secondary | ICD-10-CM | POA: Insufficient documentation

## 2017-05-31 DIAGNOSIS — G4730 Sleep apnea, unspecified: Secondary | ICD-10-CM | POA: Insufficient documentation

## 2017-05-31 DIAGNOSIS — Z79891 Long term (current) use of opiate analgesic: Secondary | ICD-10-CM | POA: Insufficient documentation

## 2017-05-31 DIAGNOSIS — R102 Pelvic and perineal pain: Secondary | ICD-10-CM | POA: Diagnosis present

## 2017-05-31 DIAGNOSIS — Z9884 Bariatric surgery status: Secondary | ICD-10-CM | POA: Diagnosis not present

## 2017-05-31 DIAGNOSIS — Z794 Long term (current) use of insulin: Secondary | ICD-10-CM | POA: Diagnosis not present

## 2017-05-31 DIAGNOSIS — K660 Peritoneal adhesions (postprocedural) (postinfection): Secondary | ICD-10-CM | POA: Diagnosis not present

## 2017-05-31 DIAGNOSIS — Z87891 Personal history of nicotine dependence: Secondary | ICD-10-CM | POA: Diagnosis not present

## 2017-05-31 DIAGNOSIS — N800 Endometriosis of uterus: Secondary | ICD-10-CM | POA: Diagnosis not present

## 2017-05-31 DIAGNOSIS — E785 Hyperlipidemia, unspecified: Secondary | ICD-10-CM | POA: Diagnosis not present

## 2017-05-31 DIAGNOSIS — F329 Major depressive disorder, single episode, unspecified: Secondary | ICD-10-CM | POA: Diagnosis not present

## 2017-05-31 DIAGNOSIS — I10 Essential (primary) hypertension: Secondary | ICD-10-CM | POA: Insufficient documentation

## 2017-05-31 DIAGNOSIS — E039 Hypothyroidism, unspecified: Secondary | ICD-10-CM | POA: Diagnosis not present

## 2017-05-31 DIAGNOSIS — J449 Chronic obstructive pulmonary disease, unspecified: Secondary | ICD-10-CM | POA: Diagnosis not present

## 2017-05-31 HISTORY — PX: LAPAROSCOPIC HYSTERECTOMY: SHX1926

## 2017-05-31 HISTORY — PX: LAPAROSCOPIC BILATERAL SALPINGECTOMY: SHX5889

## 2017-05-31 HISTORY — PX: CYSTOSCOPY: SHX5120

## 2017-05-31 LAB — GLUCOSE, CAPILLARY
GLUCOSE-CAPILLARY: 199 mg/dL — AB (ref 65–99)
Glucose-Capillary: 121 mg/dL — ABNORMAL HIGH (ref 65–99)
Glucose-Capillary: 116 mg/dL — ABNORMAL HIGH (ref 65–99)
Glucose-Capillary: 139 mg/dL — ABNORMAL HIGH (ref 65–99)

## 2017-05-31 LAB — POCT PREGNANCY, URINE: PREG TEST UR: NEGATIVE

## 2017-05-31 LAB — ABO/RH: ABO/RH(D): AB POS

## 2017-05-31 SURGERY — HYSTERECTOMY, TOTAL, LAPAROSCOPIC
Anesthesia: General

## 2017-05-31 MED ORDER — GABAPENTIN 300 MG PO CAPS: 900.0000 mg | ORAL_CAPSULE | ORAL | Status: DC

## 2017-05-31 MED ORDER — GABAPENTIN 800 MG PO TABS: 800.0000 mg | ORAL_TABLET | Freq: Every day | ORAL | 0 refills | Status: DC

## 2017-05-31 MED ORDER — FENTANYL CITRATE (PF) 100 MCG/2ML IJ SOLN
INTRAMUSCULAR | Status: AC
  Filled 2017-05-31: qty 2

## 2017-05-31 MED ORDER — VITAMIN B-12 1000 MCG PO TABS
1000.0000 ug | ORAL_TABLET | Freq: Every day | ORAL | Status: DC
Start: 2017-05-31 — End: 2017-06-01
  Administered 2017-06-01: 1000 ug via ORAL
  Filled 2017-05-31 (×2): qty 1

## 2017-05-31 MED ORDER — DOCUSATE SODIUM 100 MG PO CAPS
100.0000 mg | ORAL_CAPSULE | Freq: Two times a day (BID) | ORAL | Status: DC
  Administered 2017-05-31 – 2017-06-01 (×2): 100 mg via ORAL
  Filled 2017-05-31 (×2): qty 1

## 2017-05-31 MED ORDER — LIDOCAINE HCL (CARDIAC) 20 MG/ML IV SOLN
INTRAVENOUS | Status: DC | PRN
  Administered 2017-05-31: 100 mg via INTRAVENOUS

## 2017-05-31 MED ORDER — SERTRALINE HCL 25 MG PO TABS
150.0000 mg | ORAL_TABLET | Freq: Every day | ORAL | Status: DC
  Administered 2017-06-01: 150 mg via ORAL
  Filled 2017-05-31: qty 1

## 2017-05-31 MED ORDER — FENTANYL CITRATE (PF) 100 MCG/2ML IJ SOLN
INTRAMUSCULAR | Status: AC
  Administered 2017-05-31: 25 ug via INTRAVENOUS
  Filled 2017-05-31: qty 2

## 2017-05-31 MED ORDER — CELECOXIB 200 MG PO CAPS
ORAL_CAPSULE | ORAL | Status: AC
  Administered 2017-05-31: 400 mg via ORAL
  Filled 2017-05-31: qty 2

## 2017-05-31 MED ORDER — LIDOCAINE HCL (PF) 2 % IJ SOLN
INTRAMUSCULAR | Status: AC
  Filled 2017-05-31: qty 10

## 2017-05-31 MED ORDER — DOCUSATE SODIUM 100 MG PO CAPS: 100.0000 mg | ORAL_CAPSULE | Freq: Two times a day (BID) | ORAL | 0 refills | Status: DC

## 2017-05-31 MED ORDER — MIDAZOLAM HCL 2 MG/2ML IJ SOLN
INTRAMUSCULAR | Status: AC
  Filled 2017-05-31: qty 2

## 2017-05-31 MED ORDER — PRAVASTATIN SODIUM 40 MG PO TABS
80.0000 mg | ORAL_TABLET | ORAL | Status: DC
  Administered 2017-06-01: 80 mg via ORAL
  Filled 2017-05-31: qty 2

## 2017-05-31 MED ORDER — ROCURONIUM BROMIDE 100 MG/10ML IV SOLN
INTRAVENOUS | Status: DC | PRN
  Administered 2017-05-31: 20 mg via INTRAVENOUS
  Administered 2017-05-31: 10 mg via INTRAVENOUS
  Administered 2017-05-31: 30 mg via INTRAVENOUS
  Administered 2017-05-31: 10 mg via INTRAVENOUS

## 2017-05-31 MED ORDER — GABAPENTIN 400 MG PO CAPS
ORAL_CAPSULE | ORAL | Status: AC
Start: 2017-05-31 — End: 2017-06-01
  Filled 2017-05-31: qty 2

## 2017-05-31 MED ORDER — LEVOTHYROXINE SODIUM 175 MCG PO TABS: 87.5000 ug | ORAL_TABLET | ORAL | Status: DC

## 2017-05-31 MED ORDER — CELECOXIB 200 MG PO CAPS
400.0000 mg | ORAL_CAPSULE | ORAL | Status: AC
  Administered 2017-05-31: 400 mg via ORAL

## 2017-05-31 MED ORDER — ONDANSETRON HCL 4 MG/2ML IJ SOLN
INTRAMUSCULAR | Status: AC
Start: 2017-05-31 — End: 2017-05-31
  Filled 2017-05-31: qty 2

## 2017-05-31 MED ORDER — LACTATED RINGERS IV SOLN
INTRAVENOUS | Status: DC
  Administered 2017-05-31 – 2017-06-01 (×2): via INTRAVENOUS

## 2017-05-31 MED ORDER — BUPROPION HCL ER (XL) 300 MG PO TB24
300.0000 mg | ORAL_TABLET | Freq: Every day | ORAL | Status: DC
  Administered 2017-06-01: 300 mg via ORAL
  Filled 2017-05-31 (×2): qty 1

## 2017-05-31 MED ORDER — ROCURONIUM BROMIDE 50 MG/5ML IV SOLN
INTRAVENOUS | Status: AC
  Filled 2017-05-31: qty 1

## 2017-05-31 MED ORDER — BUPIVACAINE HCL (PF) 0.5 % IJ SOLN
INTRAMUSCULAR | Status: AC
  Filled 2017-05-31: qty 30

## 2017-05-31 MED ORDER — SUGAMMADEX SODIUM 200 MG/2ML IV SOLN
INTRAVENOUS | Status: DC | PRN
  Administered 2017-05-31: 250 mg via INTRAVENOUS

## 2017-05-31 MED ORDER — PROPOFOL 10 MG/ML IV BOLUS
INTRAVENOUS | Status: AC
  Filled 2017-05-31: qty 20

## 2017-05-31 MED ORDER — ACETAMINOPHEN 500 MG PO TABS: 1000.0000 mg | ORAL_TABLET | Freq: Four times a day (QID) | ORAL | 0 refills | Status: AC

## 2017-05-31 MED ORDER — KETOROLAC TROMETHAMINE 30 MG/ML IJ SOLN
INTRAMUSCULAR | Status: AC
  Filled 2017-05-31: qty 1

## 2017-05-31 MED ORDER — ONDANSETRON 4 MG PO TBDP: 4.0000 mg | ORAL_TABLET | Freq: Four times a day (QID) | ORAL | 0 refills | Status: DC | PRN

## 2017-05-31 MED ORDER — PROPOFOL 10 MG/ML IV BOLUS
INTRAVENOUS | Status: DC | PRN
  Administered 2017-05-31: 100 mg via INTRAVENOUS
  Administered 2017-05-31: 50 mg via INTRAVENOUS

## 2017-05-31 MED ORDER — GABAPENTIN 400 MG PO CAPS
ORAL_CAPSULE | ORAL | Status: AC
  Administered 2017-05-31: 800 mg via ORAL
  Filled 2017-05-31: qty 2

## 2017-05-31 MED ORDER — ACETAMINOPHEN 500 MG PO TABS
ORAL_TABLET | ORAL | Status: AC
  Administered 2017-05-31: 1000 mg via ORAL
  Filled 2017-05-31: qty 2

## 2017-05-31 MED ORDER — ONDANSETRON HCL 4 MG/2ML IJ SOLN
INTRAMUSCULAR | Status: DC | PRN
  Administered 2017-05-31: 4 mg via INTRAVENOUS

## 2017-05-31 MED ORDER — LEVOTHYROXINE SODIUM 175 MCG PO TABS
175.0000 ug | ORAL_TABLET | ORAL | Status: DC
  Administered 2017-06-01: 175 ug via ORAL
  Filled 2017-05-31: qty 1

## 2017-05-31 MED ORDER — SODIUM CHLORIDE 0.9 % IV SOLN
INTRAVENOUS | Status: DC
  Administered 2017-05-31: 11:00:00 via INTRAVENOUS

## 2017-05-31 MED ORDER — ADULT MULTIVITAMIN W/MINERALS CH
1.0000 | ORAL_TABLET | Freq: Every day | ORAL | Status: DC
  Administered 2017-06-01: 1 via ORAL
  Filled 2017-05-31: qty 1

## 2017-05-31 MED ORDER — GABAPENTIN 400 MG PO CAPS
800.0000 mg | ORAL_CAPSULE | ORAL | Status: AC
  Administered 2017-05-31: 800 mg via ORAL

## 2017-05-31 MED ORDER — FENTANYL CITRATE (PF) 100 MCG/2ML IJ SOLN
INTRAMUSCULAR | Status: DC | PRN
  Administered 2017-05-31 (×4): 50 ug via INTRAVENOUS

## 2017-05-31 MED ORDER — HYDROMORPHONE HCL 1 MG/ML IJ SOLN
0.2000 mg | INTRAMUSCULAR | Status: DC | PRN
  Administered 2017-05-31: 0.6 mg via INTRAVENOUS
  Filled 2017-05-31: qty 1

## 2017-05-31 MED ORDER — FENTANYL CITRATE (PF) 100 MCG/2ML IJ SOLN
25.0000 ug | INTRAMUSCULAR | Status: AC | PRN
  Administered 2017-05-31 (×6): 25 ug via INTRAVENOUS

## 2017-05-31 MED ORDER — MIDAZOLAM HCL 2 MG/2ML IJ SOLN
INTRAMUSCULAR | Status: DC | PRN
  Administered 2017-05-31: 2 mg via INTRAVENOUS

## 2017-05-31 MED ORDER — KETOROLAC TROMETHAMINE 30 MG/ML IJ SOLN
30.0000 mg | Freq: Once | INTRAMUSCULAR | Status: AC
  Administered 2017-05-31: 30 mg via INTRAVENOUS

## 2017-05-31 MED ORDER — HYDROMORPHONE HCL 1 MG/ML IJ SOLN
0.6000 mg | INTRAMUSCULAR | Status: DC | PRN
  Administered 2017-05-31 – 2017-06-01 (×6): 0.6 mg via INTRAVENOUS
  Filled 2017-05-31 (×6): qty 1

## 2017-05-31 MED ORDER — HYDROMORPHONE HCL 1 MG/ML IJ SOLN
INTRAMUSCULAR | Status: AC
  Administered 2017-05-31: 0.5 mg via INTRAVENOUS
  Filled 2017-05-31: qty 1

## 2017-05-31 MED ORDER — CLINDAMYCIN PHOSPHATE 900 MG/50ML IV SOLN
INTRAVENOUS | Status: AC
  Filled 2017-05-31: qty 50

## 2017-05-31 MED ORDER — GLYCOPYRROLATE 0.2 MG/ML IJ SOLN
INTRAMUSCULAR | Status: DC | PRN
  Administered 2017-05-31: 0.2 mg via INTRAVENOUS

## 2017-05-31 MED ORDER — GABAPENTIN 400 MG PO CAPS
800.0000 mg | ORAL_CAPSULE | Freq: Three times a day (TID) | ORAL | Status: DC
  Administered 2017-05-31: 400 mg via ORAL
  Administered 2017-06-01: 800 mg via ORAL
  Filled 2017-05-31 (×2): qty 8
  Filled 2017-05-31 (×4): qty 2

## 2017-05-31 MED ORDER — BIOTIN 1000 MCG PO CHEW: 1.0000 | CHEWABLE_TABLET | Freq: Every day | ORAL | Status: DC

## 2017-05-31 MED ORDER — DOXYCYCLINE HYCLATE 100 MG PO TABS
100.0000 mg | ORAL_TABLET | Freq: Two times a day (BID) | ORAL | Status: DC
  Administered 2017-05-31 – 2017-06-01 (×2): 100 mg via ORAL
  Filled 2017-05-31 (×3): qty 1

## 2017-05-31 MED ORDER — ACETAMINOPHEN 500 MG PO TABS
1000.0000 mg | ORAL_TABLET | ORAL | Status: AC
  Administered 2017-05-31: 1000 mg via ORAL

## 2017-05-31 MED ORDER — DEXAMETHASONE SODIUM PHOSPHATE 10 MG/ML IJ SOLN
INTRAMUSCULAR | Status: AC
  Filled 2017-05-31: qty 1

## 2017-05-31 MED ORDER — INSULIN PUMP
Freq: Three times a day (TID) | SUBCUTANEOUS | Status: DC
  Administered 2017-05-31: 11.1 via SUBCUTANEOUS
  Administered 2017-05-31: 6 via SUBCUTANEOUS
  Administered 2017-06-01: 02:00:00 via SUBCUTANEOUS
  Administered 2017-06-01: 8 via SUBCUTANEOUS
  Administered 2017-06-01: 08:00:00 via SUBCUTANEOUS
  Administered 2017-06-01: 2.4 via SUBCUTANEOUS
  Administered 2017-06-01: 11.2 via SUBCUTANEOUS
  Filled 2017-05-31: qty 1

## 2017-05-31 MED ORDER — MENTHOL 3 MG MT LOZG
1.0000 | LOZENGE | OROMUCOSAL | Status: DC | PRN
  Filled 2017-05-31: qty 9

## 2017-05-31 MED ORDER — BUPIVACAINE HCL 0.5 % IJ SOLN
INTRAMUSCULAR | Status: DC | PRN
  Administered 2017-05-31: 15 mL

## 2017-05-31 MED ORDER — LOSARTAN POTASSIUM 25 MG PO TABS
12.5000 mg | ORAL_TABLET | Freq: Every day | ORAL | Status: DC
  Administered 2017-06-01: 12.5 mg via ORAL
  Filled 2017-05-31 (×2): qty 0.5

## 2017-05-31 MED ORDER — HYDROMORPHONE HCL 1 MG/ML IJ SOLN
0.2500 mg | INTRAMUSCULAR | Status: DC | PRN
  Administered 2017-05-31 (×3): 0.5 mg via INTRAVENOUS

## 2017-05-31 MED ORDER — ONDANSETRON HCL 4 MG/2ML IJ SOLN: 4.0000 mg | Freq: Once | INTRAMUSCULAR | Status: DC | PRN

## 2017-05-31 MED ORDER — PHENYLEPHRINE HCL 10 MG/ML IJ SOLN
INTRAMUSCULAR | Status: DC | PRN
  Administered 2017-05-31 (×3): 100 ug via INTRAVENOUS

## 2017-05-31 MED ORDER — DEXAMETHASONE SODIUM PHOSPHATE 10 MG/ML IJ SOLN
INTRAMUSCULAR | Status: DC | PRN
  Administered 2017-05-31: 5 mg via INTRAVENOUS

## 2017-05-31 MED ORDER — SUGAMMADEX SODIUM 500 MG/5ML IV SOLN
INTRAVENOUS | Status: AC
  Filled 2017-05-31: qty 5

## 2017-05-31 MED ORDER — ONDANSETRON HCL 4 MG PO TABS: 4.0000 mg | ORAL_TABLET | Freq: Four times a day (QID) | ORAL | Status: DC | PRN

## 2017-05-31 MED ORDER — IBUPROFEN 800 MG PO TABS: 800.0000 mg | ORAL_TABLET | Freq: Three times a day (TID) | ORAL | 1 refills | Status: AC | PRN

## 2017-05-31 MED ORDER — LISDEXAMFETAMINE DIMESYLATE 50 MG PO CAPS
50.0000 mg | ORAL_CAPSULE | Freq: Every day | ORAL | Status: DC
  Filled 2017-05-31: qty 1

## 2017-05-31 MED ORDER — ONDANSETRON HCL 4 MG/2ML IJ SOLN: 4.0000 mg | Freq: Four times a day (QID) | INTRAMUSCULAR | Status: DC | PRN

## 2017-05-31 MED ORDER — OXYCODONE HCL 5 MG PO CAPS: 5.0000 mg | ORAL_CAPSULE | Freq: Four times a day (QID) | ORAL | 0 refills | Status: DC | PRN

## 2017-05-31 SURGICAL SUPPLY — 63 items
BAG URO DRAIN 2000ML W/SPOUT (MISCELLANEOUS) ×5 IMPLANT
BLADE SURG SZ11 CARB STEEL (BLADE) ×5 IMPLANT
CATH FOLEY 2WAY  5CC 16FR (CATHETERS) ×2
CATH ROBINSON RED A/P 16FR (CATHETERS) ×5 IMPLANT
CATH URTH 16FR FL 2W BLN LF (CATHETERS) ×3 IMPLANT
CHLORAPREP W/TINT 26ML (MISCELLANEOUS) ×5 IMPLANT
CLOSURE WOUND 1/4X4 (GAUZE/BANDAGES/DRESSINGS) ×1
CORD MONOPOLAR M/FML 12FT (MISCELLANEOUS) ×5 IMPLANT
COUNTER NEEDLE 20/40 LG (NEEDLE) ×5 IMPLANT
COVER LIGHT HANDLE STERIS (MISCELLANEOUS) ×10 IMPLANT
DEFOGGER SCOPE WARMER CLEARIFY (MISCELLANEOUS) ×5 IMPLANT
DERMABOND ADVANCED (GAUZE/BANDAGES/DRESSINGS) ×2
DERMABOND ADVANCED .7 DNX12 (GAUZE/BANDAGES/DRESSINGS) ×3 IMPLANT
DRSG TEGADERM 2-3/8X2-3/4 SM (GAUZE/BANDAGES/DRESSINGS) ×15 IMPLANT
GAUZE SPONGE NON-WVN 2X2 STRL (MISCELLANEOUS) ×6 IMPLANT
GLOVE BIO SURGEON STRL SZ7 (GLOVE) ×25 IMPLANT
GLOVE BIO SURGEON STRL SZ8 (GLOVE) ×5 IMPLANT
GLOVE INDICATOR 7.5 STRL GRN (GLOVE) ×25 IMPLANT
GOWN STRL REUS W/ TWL LRG LVL3 (GOWN DISPOSABLE) ×6 IMPLANT
GOWN STRL REUS W/ TWL XL LVL3 (GOWN DISPOSABLE) ×3 IMPLANT
GOWN STRL REUS W/TWL LRG LVL3 (GOWN DISPOSABLE) ×4
GOWN STRL REUS W/TWL XL LVL3 (GOWN DISPOSABLE) ×2
HANDLE YANKAUER SUCT BULB TIP (MISCELLANEOUS) ×10 IMPLANT
IRRIGATION STRYKERFLOW (MISCELLANEOUS) ×6 IMPLANT
IRRIGATOR STRYKERFLOW (MISCELLANEOUS) ×10
IV NS 1000ML (IV SOLUTION) ×2
IV NS 1000ML BAXH (IV SOLUTION) ×3 IMPLANT
KIT PINK PAD W/HEAD ARE REST (MISCELLANEOUS) ×5
KIT PINK PAD W/HEAD ARM REST (MISCELLANEOUS) ×3 IMPLANT
KIT RM TURNOVER CYSTO AR (KITS) ×5 IMPLANT
LABEL OR SOLS (LABEL) ×5 IMPLANT
LIGASURE BLUNT 5MM 37CM (INSTRUMENTS) ×5 IMPLANT
MANIPULATOR VCARE LG CRV RETR (MISCELLANEOUS) IMPLANT
MANIPULATOR VCARE SML CRV RETR (MISCELLANEOUS) IMPLANT
MANIPULATOR VCARE STD CRV RETR (MISCELLANEOUS) ×5 IMPLANT
NS IRRIG 500ML POUR BTL (IV SOLUTION) ×5 IMPLANT
OCCLUDER COLPOPNEUMO (BALLOONS) ×5 IMPLANT
PACK GYN LAPAROSCOPIC (MISCELLANEOUS) ×5 IMPLANT
PAD OB MATERNITY 4.3X12.25 (PERSONAL CARE ITEMS) ×5 IMPLANT
PAD PREP 24X41 OB/GYN DISP (PERSONAL CARE ITEMS) ×5 IMPLANT
POUCH SPECIMEN RETRIEVAL 10MM (ENDOMECHANICALS) IMPLANT
SCISSORS METZENBAUM CVD 33 (INSTRUMENTS) ×5 IMPLANT
SET CYSTO W/LG BORE CLAMP LF (SET/KITS/TRAYS/PACK) ×5 IMPLANT
SLEEVE ENDOPATH XCEL 5M (ENDOMECHANICALS) ×5 IMPLANT
SPONGE VERSALON 2X2 STRL (MISCELLANEOUS) ×4
SPONGE XRAY 4X4 16PLY STRL (MISCELLANEOUS) ×5 IMPLANT
STRIP CLOSURE SKIN 1/4X4 (GAUZE/BANDAGES/DRESSINGS) ×4 IMPLANT
SUT MNCRL 4-0 (SUTURE) ×2
SUT MNCRL 4-0 27XMFL (SUTURE) ×3
SUT MNCRL AB 4-0 PS2 18 (SUTURE) ×5 IMPLANT
SUT VIC AB 0 CT1 36 (SUTURE) ×5 IMPLANT
SUT VIC AB 2-0 UR6 27 (SUTURE) ×5 IMPLANT
SUT VIC AB 4-0 SH 27 (SUTURE) ×2
SUT VIC AB 4-0 SH 27XANBCTRL (SUTURE) ×3 IMPLANT
SUTURE MNCRL 4-0 27XMF (SUTURE) ×3 IMPLANT
SWABSTK COMLB BENZOIN TINCTURE (MISCELLANEOUS) ×5 IMPLANT
SYR 50ML LL SCALE MARK (SYRINGE) ×5 IMPLANT
SYRINGE 10CC LL (SYRINGE) ×5 IMPLANT
TROCAR XCEL NON-BLD 5MMX100MML (ENDOMECHANICALS) ×10 IMPLANT
TUBING CONNECTING 10 (TUBING) ×4 IMPLANT
TUBING CONNECTING 10' (TUBING) ×1
TUBING INSUF HEATED (TUBING) ×5 IMPLANT
TUBING INSUFFLATOR HI FLOW (MISCELLANEOUS) ×5 IMPLANT

## 2017-05-31 NOTE — Transfer of Care (Signed)
Immediate Anesthesia Transfer of Care Note  Patient: Brandy Bryant  Procedure(s) Performed: HYSTERECTOMY TOTAL LAPAROSCOPIC (N/A ) LAPAROSCOPIC BILATERAL SALPINGECTOMY (Bilateral ) CYSTOSCOPY  Patient Location: PACU  Anesthesia Type:General  Level of Consciousness: sedated  Airway & Oxygen Therapy: Patient Spontanous Breathing and Patient connected to face mask oxygen  Post-op Assessment: Report given to RN and Post -op Vital signs reviewed and stable  Post vital signs: Reviewed and stable  Last Vitals:  Vitals:   05/31/17 1103  BP: 129/82  Pulse: 86  Resp: 20  Temp: (!) 36.3 C  SpO2: 99%    Last Pain:  Vitals:   05/31/17 1103  TempSrc: Tympanic         Complications: No apparent anesthesia complications

## 2017-05-31 NOTE — Anesthesia Post-op Follow-up Note (Signed)
Anesthesia QCDR form completed.        

## 2017-05-31 NOTE — Anesthesia Postprocedure Evaluation (Signed)
Anesthesia Post Note  Patient: Brandy Bryant  Procedure(s) Performed: HYSTERECTOMY TOTAL LAPAROSCOPIC (N/A ) LAPAROSCOPIC BILATERAL SALPINGECTOMY (Bilateral ) CYSTOSCOPY  Patient location during evaluation: PACU Anesthesia Type: General Level of consciousness: awake and alert Pain management: pain level controlled Vital Signs Assessment: post-procedure vital signs reviewed and stable Respiratory status: spontaneous breathing, nonlabored ventilation, respiratory function stable and patient connected to nasal cannula oxygen Cardiovascular status: blood pressure returned to baseline and stable Postop Assessment: no apparent nausea or vomiting Anesthetic complications: no     Last Vitals:  Vitals:   05/31/17 1622 05/31/17 1640  BP: 139/90 (!) 146/82  Pulse: 89 86  Resp: 15 14  Temp:    SpO2: 91% 93%    Last Pain:  Vitals:   05/31/17 1640  TempSrc:   PainSc: 8                  [name] [name]

## 2017-05-31 NOTE — Anesthesia Preprocedure Evaluation (Signed)
Anesthesia Evaluation  Patient identified by MRN, date of birth, ID band Patient awake    Reviewed: Allergy & Precautions, NPO status , Patient's Chart, lab work & pertinent test results  History of Anesthesia Complications Negative for: history of anesthetic complications  Airway Mallampati: III       Dental   Pulmonary sleep apnea and Continuous Positive Airway Pressure Ventilation , neg COPD, former smoker,           Cardiovascular hypertension, Pt. on medications + Peripheral Vascular Disease  (-) Past MI and (-) CHF (-) dysrhythmias (-) Valvular Problems/Murmurs     Neuro/Psych Depression    GI/Hepatic Neg liver ROS, GERD  Medicated,  Endo/Other  diabetes, Type 1, Insulin DependentHypothyroidism   Renal/GU negative Renal ROS     Musculoskeletal   Abdominal   Peds  Hematology   Anesthesia Other Findings   Reproductive/Obstetrics                            Anesthesia Physical Anesthesia Plan  ASA: III  Anesthesia Plan: General   Post-op Pain Management:    Induction: Intravenous  PONV Risk Score and Plan: 4 or greater and Ondansetron, Dexamethasone, Midazolam and Treatment may vary due to age or medical condition  Airway Management Planned: Oral ETT  Additional Equipment:   Intra-op Plan:   Post-operative Plan:   Informed Consent: I have reviewed the patients History and Physical, chart, labs and discussed the procedure including the risks, benefits and alternatives for the proposed anesthesia with the patient or authorized representative who has indicated his/her understanding and acceptance.     Plan Discussed with:   Anesthesia Plan Comments:         Anesthesia Quick Evaluation

## 2017-05-31 NOTE — Op Note (Signed)
Amil Amen Click Miers PROCEDURE DATE: 05/31/2017  PREOPERATIVE DIAGNOSIS: Pelvic pain POSTOPERATIVE DIAGNOSIS: The same, with bowel adhesions on left and bleeding from left fallopian tube. Possible endometriosis in this area. Possible diverticulitis.  PROCEDURE: Total laparoscopic hysterectomy with vaginal closure, bilateral salpingectomy SURGEON:  Dr. [name] ASSISTANT: Dr. Jennell Corner Anesthesiologist:  Anesthesiologist: Naomie Dean, MD; Piscitello, Cleda Mccreedy, MD CRNA: Ginger Carne, CRNA; Karoline Caldwell, CRNA  INDICATIONS: 46 y.o. F here for definitive surgical management secondary to the indications listed under preoperative diagnoses; please see preoperative note for further details.  Risks of surgery were discussed with the patient including but not limited to: bleeding which may require transfusion or reoperation; infection which may require antibiotics; injury to bowel, bladder, ureters or other surrounding organs; need for additional procedures; thromboembolic phenomenon, incisional problems and other postoperative/anesthesia complications. Written informed consent was obtained.    FINDINGS:  Small uterus, bowel adhesions to left cornua with possible endometriosis vs diverticulitis in this area.   ANESTHESIA:    General INTRAVENOUS FLUIDS:1200  ml ESTIMATED BLOOD LOSS:150 ml URINE OUTPUT: 50 ml   SPECIMENS: Uterus, cervix, bilateral fallopian tubes  COMPLICATIONS: None immediate  PROCEDURE IN DETAIL:  The patient received prophalactic intravenous antibiotics and had sequential compression devices applied to her lower extremities while in the preoperative area.  She was then taken to the operating room where general anesthesia was administered and was found to be adequate.  She was placed in the dorsal lithotomy position, and was prepped and draped in a sterile manner.  A formal time out was performed with all team members present and in agreement.  A V-care  uterine manipulator was placed at this time.  A Foley catheter was inserted into her bladder and attached to constant drainage. Attention was turned to the abdomen and 0.5% Marcaine infused subq. A 5mm umbilical incision was made with the scalpel.  The Optiview 5-mm trocar and sleeve were then advanced without difficulty with the laparoscope under direct visualization into the abdomen.  The abdomen was then insufflated with carbon dioxide gas and adequate pneumoperitoneum was obtained.  A survey of the patient's pelvis and abdomen revealed the findings above.  Bilateral lower quadrant ports (5 mm on the right and 5 mm on the left) were then placed under direct visualization.  The pelvis was then carefully examined. The epiploica of the sigmoid was carefully detached using the Ligasure. Examination of the bowel revealed no bowel injury. Bilateral ureters were evaluated.   Attention was turned to the fallopian tubes; these were freed from the underlying mesosalpinx and the uterine attachments using the Ligasure device.  The bilateral round and broad ligaments were then clamped and transected with the Ligasure device.  The uterine artery was then skeletonized and a bladder flap was created.  The ureters were noted to be safely away from the area of dissection.  The bladder was then bluntly dissected off the lower uterine segment.    At this point, attention was turned to the uterine vessels, which were clamped and cauterized using the Ligasure on the left, and then the right. After the uterine blood flow at the level of the internal os was controlled, both arteries were cut with the Ligasure.  Good hemostasis was noted overall.  The uterosacral and cardinal ligaments were clamped, cut and ligated bilaterally .  Attention was then turned to the cervicovaginal junction, and monopolar scissors were used to transect the cervix from the surrounding vagina using the ring of the V-care as a  guide. This was done  circumferentially allowing total hysterectomy.  The uterus was then removed from the vagina and the vaginal cuff incision was then closed with running 0 Vicryl from below.  Overall excellent hemostasis was noted.    Attention was returned to the abdomen.The ureters were reexamined bilaterally and were pulsating normally. The abdominal pressure was reduced and hemostasis was confirmed.   Intravenous floruoceine was administered, and cystoscopy showed bilateral ureteral jets.  No stitches were visualized in the bladder during cystoscopy.  All trocars were removed under direct visualization, and the abdomen was desufflated.  All skin incisions were closed with 4-0 Vicryl subcuticular stitches and Dermabond. The patient tolerated the procedures well.  All instruments, needles, and sponge counts were correct x 2. The patient was taken to the recovery room awake, extubated and in stable condition.   She will be discharged home using ERAS protocol.

## 2017-05-31 NOTE — Interval H&P Note (Signed)
History and Physical Interval Note:'  Physical exam: Cards- RRR, no murmurs or rubs Pulm- CTAB, small atelectasis in LLL  05/31/2017 11:55 AM  Brandy Bryant  has presented today for surgery, with the diagnosis of pelvic pain  The various methods of treatment have been discussed with the patient and family. After consideration of risks, benefits and other options for treatment, the patient has consented to  Procedure(s): HYSTERECTOMY TOTAL LAPAROSCOPIC (N/A) LAPAROSCOPIC BILATERAL SALPINGECTOMY (Bilateral) as a surgical intervention .  The patient's history has been reviewed, patient examined, no change in status, stable for surgery.  I have reviewed the patient's chart and labs.  Questions were answered to the patient's satisfaction.     Brandy Bryant

## 2017-05-31 NOTE — Progress Notes (Signed)

## 2017-05-31 NOTE — OR Nursing (Signed)
Dr Henrene Hawking and patient had a conversation at length about her insulin pump etc.  Patient may keep pump running during surgery.

## 2017-05-31 NOTE — Discharge Instructions (Signed)
AMBULATORY SURGERY  DISCHARGE INSTRUCTIONS   1) The drugs that you were given will stay in your system until tomorrow so for the next 24 hours you should not:  A) Drive an automobile B) Make any legal decisions C) Drink any alcoholic beverage   2) You may resume regular meals tomorrow.  Today it is better to start with liquids and gradually work up to solid foods.  You may eat anything you prefer, but it is better to start with liquids, then soup and crackers, and gradually work up to solid foods.   3) Please notify your doctor immediately if you have any unusual bleeding, trouble breathing, redness and pain at the surgery site, drainage, fever, or pain not relieved by medication. 4)   5) Your post-operative visit with Dr.                                     is: Date:                        Time:    Please call to schedule your post-operative visit.  6) Additional Instructions:       Discharge instructions after   total laparoscopic hysterectomy  Signs and Symptoms to Report Call our office at [phone] if you have any of the following.   Fever over 100.4 degrees or higher  Severe stomach pain not relieved with pain medications  Bright red bleeding thats heavier than a period that does not slow with rest  To go the bathroom a lot (frequency), you cant hold your urine (urgency), or it hurts when you empty your bladder (urinate)  Chest pain  Shortness of breath  Pain in the calves of your legs  Severe nausea and vomiting not relieved with anti-nausea medications  Signs of infection around your wounds, such as redness, hot to touch, swelling, green/yellow drainage (like pus), bad smelling discharge  Any concerns  What You Can Expect after Surgery  You may see some pink tinged, bloody fluid and bruising around the wound. This is normal.  You may notice shoulder and neck pain. This is caused by the gas used during surgery to expand your abdomen so your  surgeon could get to the uterus easier.  You may have a sore throat because of the tube in your mouth during general anesthesia. This will go away in 2 to 3 days.  You may have some stomach cramps.  You may notice spotting on your panties.  You may have pain around the incision sites.   Activities after Your Discharge Follow these guidelines to help speed your recovery at home:  Do the coughing and deep breathing as you did in the hospital for 2 weeks. Use the small blue breathing device, called the incentive spirometer for 2 weeks.  Dont drive if you are in pain or taking narcotic pain medicine. You may drive when you can safely slam on the brakes, turn the wheel forcefully, and rotate your torso comfortably. This is typically 1-2 weeks. Practice in a parking lot or side street prior to attempting to drive regularly.   Ask others to help with household chores for 4 weeks.  Do not lift anything heavier that 10 pounds for 4-6 weeks. This includes pets, children, and groceries.  Dont do strenuous activities, exercises, or sports like vacuuming, tennis, squash, etc. until your doctor says it is  safe to do so. ---Maintain pelvic rest for 8 weeks. This means nothing in the vagina or rectum at all (no douching, tampons, intercourse) for 8 weeks.   Walk as you feel able. Rest often since it may take two or three weeks for your energy level to return to normal.   You may climb stairs  Avoid constipation:   -Eat fruits, vegetables, and whole grains. Eat small meals as your appetite will take time to return to normal.   -Drink 6 to 8 glasses of water each day unless your doctor has told you to limit your fluids.   -Use a laxative or stool softener as needed if constipation becomes a problem. You may take Miralax, metamucil, Citrucil, Colace, Senekot, FiberCon, etc. If this does not relieve the constipation, try two tablespoons of Milk Of Magnesia every 8 hours until your bowels move.   You  may shower. Gently wash the wounds with a mild soap and water. Pat dry.  Do not get in a hot tub, swimming pool, etc. for 6 weeks.  Do not use lotions, oils, powders on the wounds.  Do not douche, use tampons, or have sex until your doctor says it is okay.  Take your pain medicine when you need it. The medicine may not work as well if the pain is bad.  Take the medicines you were taking before surgery. Other medications you will need are pain medications and possibly constipation and nausea medications (Zofran).

## 2017-05-31 NOTE — Anesthesia Procedure Notes (Signed)
Procedure Name: Intubation Date/Time: 05/31/2017 1:22 PM Performed by: Ginger Carne Pre-anesthesia Checklist: Patient identified, Emergency Drugs available, Suction available, Patient being monitored and Timeout performed Patient Re-evaluated:Patient Re-evaluated prior to induction Oxygen Delivery Method: Circle system utilized Preoxygenation: Pre-oxygenation with 100% oxygen Induction Type: IV induction Ventilation: Mask ventilation without difficulty and Oral airway inserted - appropriate to patient size Laryngoscope Size: Miller and 2 Grade View: Grade II Tube type: Oral Tube size: 7.5 mm Number of attempts: 2 Airway Equipment and Method: Stylet Placement Confirmation: ETT inserted through vocal cords under direct vision,  positive ETCO2 and breath sounds checked- equal and bilateral Secured at: 21 cm Tube secured with: Tape Dental Injury: Teeth and Oropharynx as per pre-operative assessment  Difficulty Due To: Difficulty was anticipated, Difficult Airway- due to large tongue and Difficult Airway- due to limited oral opening Future Recommendations: Recommend- induction with short-acting agent, and alternative techniques readily available

## 2017-06-01 ENCOUNTER — Encounter: Payer: Self-pay | Admitting: Obstetrics and Gynecology

## 2017-06-01 DIAGNOSIS — R102 Pelvic and perineal pain: Secondary | ICD-10-CM | POA: Diagnosis not present

## 2017-06-01 LAB — BASIC METABOLIC PANEL
Anion gap: 7 (ref 5–15)
BUN: 8 mg/dL (ref 6–20)
CO2: 26 mmol/L (ref 22–32)
CREATININE: 0.68 mg/dL (ref 0.44–1.00)
Calcium: 8.4 mg/dL — ABNORMAL LOW (ref 8.9–10.3)
Chloride: 105 mmol/L (ref 101–111)
GFR calc Af Amer: >60 mL/min (ref >60)
GLUCOSE: 131 mg/dL — AB (ref 65–99)
POTASSIUM: 3.8 mmol/L (ref 3.5–5.1)
Sodium: 138 mmol/L (ref 135–145)

## 2017-06-01 LAB — GLUCOSE, CAPILLARY
GLUCOSE-CAPILLARY: 136 mg/dL — AB (ref 65–99)
Glucose-Capillary: 107 mg/dL — ABNORMAL HIGH (ref 65–99)
Glucose-Capillary: 149 mg/dL — ABNORMAL HIGH (ref 65–99)
Glucose-Capillary: 154 mg/dL — ABNORMAL HIGH (ref 65–99)

## 2017-06-01 LAB — CBC
HCT: 37.5 % (ref 35.0–47.0)
Hemoglobin: 12.6 g/dL (ref 12.0–16.0)
MCH: 30.4 pg (ref 26.0–34.0)
MCHC: 33.7 g/dL (ref 32.0–36.0)
MCV: 90.2 fL (ref 80.0–100.0)
PLATELETS: 268 10*3/uL (ref 150–440)
RBC: 4.16 MIL/uL (ref 3.80–5.20)
RDW: 13.2 % (ref 11.5–14.5)
WBC: 11.4 10*3/uL — ABNORMAL HIGH (ref 3.6–11.0)

## 2017-06-01 MED ORDER — LISDEXAMFETAMINE DIMESYLATE 50 MG PO CAPS: 50.0000 mg | ORAL_CAPSULE | Freq: Every day | ORAL | Status: DC

## 2017-06-01 MED ORDER — OXYCODONE HCL 5 MG PO TABS
5.0000 mg | ORAL_TABLET | ORAL | Status: DC | PRN
  Administered 2017-06-01: 5 mg via ORAL
  Filled 2017-06-01: qty 1

## 2017-06-01 MED ORDER — GABAPENTIN 800 MG PO TABS: 800.0000 mg | ORAL_TABLET | Freq: Every day | ORAL | 0 refills | Status: DC

## 2017-06-01 MED ORDER — INFLUENZA VAC SPLIT QUAD 0.5 ML IM SUSY: 0.5000 mL | PREFILLED_SYRINGE | INTRAMUSCULAR | Status: DC

## 2017-06-01 MED ORDER — LISDEXAMFETAMINE DIMESYLATE 20 MG PO CAPS: 20.0000 mg | ORAL_CAPSULE | Freq: Every day | ORAL | Status: DC

## 2017-06-01 MED ORDER — LISDEXAMFETAMINE DIMESYLATE 30 MG PO CAPS: 30.0000 mg | ORAL_CAPSULE | Freq: Every day | ORAL | Status: DC

## 2017-06-01 MED ORDER — INFLUENZA VAC SPLIT QUAD 0.5 ML IM SUSY
0.5000 mL | PREFILLED_SYRINGE | Freq: Once | INTRAMUSCULAR | Status: AC
  Administered 2017-06-01: 0.5 mL via INTRAMUSCULAR
  Filled 2017-06-01: qty 0.5

## 2017-06-01 MED ORDER — ACETAMINOPHEN 500 MG PO TABS: 1000.0000 mg | ORAL_TABLET | Freq: Three times a day (TID) | ORAL | Status: DC

## 2017-06-01 MED ORDER — IBUPROFEN 800 MG PO TABS
800.0000 mg | ORAL_TABLET | Freq: Three times a day (TID) | ORAL | Status: DC
  Administered 2017-06-01: 800 mg via ORAL
  Filled 2017-06-01: qty 1

## 2017-06-01 NOTE — Progress Notes (Signed)
Patient discharged home with family. Discharge instructions, prescriptions and follow up appointment given to and reviewed with patient and family. Patient verbalized understanding. Escorted out via wheelchair by auxiliary.  

## 2017-06-01 NOTE — Discharge Summary (Signed)
1 Day Post-Op       Procedure(s): HYSTERECTOMY TOTAL LAPAROSCOPIC (N/A) LAPAROSCOPIC BILATERAL SALPINGECTOMY (Bilateral) CYSTOSCOPY Subjective: The patient is doing well.  No nausea or vomiting. Pain is adequately controlled.  Objective: Vital signs in last 24 hours: Temp:  [97.6 F (36.4 C)-98.7 F (37.1 C)] 98.6 F (37 C) (10/09 1131) Pulse Rate:  [75-93] 75 (10/09 1131) Resp:  [12-18] 18 (10/09 1131) BP: (108-147)/(48-90) 113/60 (10/09 1131) SpO2:  [91 %-100 %] 100 % (10/09 0741) Weight:  [112 kg (247 lb)] 112 kg (247 lb) (10/09 0900)  Intake/Output  Intake/Output Summary (Last 24 hours) at 06/01/17 1219 Last data filed at 06/01/17 0900  Gross per 24 hour  Intake             1620 ml  Output             1260 ml  Net              360 ml    Physical Exam:  General: Alert and oriented. CV: RRR Lungs: Clear bilaterally. GI: Soft, Nondistended. Incisions: Clean and dry. Urine: Clear, Foley in place Extremities: Nontender, no erythema, no edema.  Lab Results:  Recent Labs  06/01/17 0509  HGB 12.6  HCT 37.5  WBC 11.4*  PLT 268                 Results for orders placed or performed during the hospital encounter of 05/31/17 (from the past 24 hour(s))  Glucose, capillary     Status: Abnormal   Collection Time: 05/31/17  3:55 PM  Result Value Ref Range   Glucose-Capillary 116 (H) 65 - 99 mg/dL  Glucose, capillary     Status: Abnormal   Collection Time: 05/31/17  8:24 PM  Result Value Ref Range   Glucose-Capillary 139 (H) 65 - 99 mg/dL  Glucose, capillary     Status: Abnormal   Collection Time: 05/31/17  9:18 PM  Result Value Ref Range   Glucose-Capillary 199 (H) 65 - 99 mg/dL  Glucose, capillary     Status: Abnormal   Collection Time: 06/01/17  2:06 AM  Result Value Ref Range   Glucose-Capillary 136 (H) 65 - 99 mg/dL  Glucose, capillary     Status: Abnormal   Collection Time: 06/01/17  3:51 AM  Result Value Ref Range   Glucose-Capillary 107 (H) 65 - 99  mg/dL  CBC     Status: Abnormal   Collection Time: 06/01/17  5:09 AM  Result Value Ref Range   WBC 11.4 (H) 3.6 - 11.0 K/uL   RBC 4.16 3.80 - 5.20 MIL/uL   Hemoglobin 12.6 12.0 - 16.0 g/dL   HCT 16.1 09.6 - 04.5 %   MCV 90.2 80.0 - 100.0 fL   MCH 30.4 26.0 - 34.0 pg   MCHC 33.7 32.0 - 36.0 g/dL   RDW 40.9 81.1 - 91.4 %   Platelets 268 150 - 440 K/uL  Basic metabolic panel     Status: Abnormal   Collection Time: 06/01/17  5:09 AM  Result Value Ref Range   Sodium 138 135 - 145 mmol/L   Potassium 3.8 3.5 - 5.1 mmol/L   Chloride 105 101 - 111 mmol/L   CO2 26 22 - 32 mmol/L   Glucose, Bld 131 (H) 65 - 99 mg/dL   BUN 8 6 - 20 mg/dL   Creatinine, Ser 7.82 0.44 - 1.00 mg/dL   Calcium 8.4 (L) 8.9 - 10.3 mg/dL   GFR calc non  Af Amer >60 >60 mL/min   GFR calc Af Amer >60 >60 mL/min   Anion gap 7 5 - 15  Glucose, capillary     Status: Abnormal   Collection Time: 06/01/17  7:58 AM  Result Value Ref Range   Glucose-Capillary 149 (H) 65 - 99 mg/dL    Assessment/Plan: 1 Day Post-Op       Procedure(s): HYSTERECTOMY TOTAL LAPAROSCOPIC (N/A) LAPAROSCOPIC BILATERAL SALPINGECTOMY (Bilateral) CYSTOSCOPY  1) Ambulate, Incentive spirometry 2) Advance diet as tolerated 3) Transition to oral pain medication 5) Discharge home today anticipated    [name], MD   LOS: 0 days   [name] 06/01/2017, 12:19 PM

## 2017-06-02 LAB — SURGICAL PATHOLOGY

## 2017-07-23 ENCOUNTER — Other Ambulatory Visit: Payer: Self-pay

## 2017-07-23 DIAGNOSIS — Z299 Encounter for prophylactic measures, unspecified: Secondary | ICD-10-CM

## 2017-07-23 NOTE — Progress Notes (Signed)
Patient came in to have blood drawn for testing per Dr. Ricky StabsKirk's orders.

## 2017-07-24 LAB — SPECIMEN STATUS

## 2017-07-28 LAB — CK ISOENZYMES
CK MB: 0 % (ref 0–3)
CK TOTAL: 72 U/L (ref 24–173)
CK-BB: 0 %
CK-MM: 100 % (ref 97–100)
MACRO TYPE 2: 0 %
Macro Type 1: 0 %

## 2017-07-28 LAB — LIPID PANEL
CHOL/HDL RATIO: 2.7 ratio (ref 0.0–4.4)
Cholesterol, Total: 159 mg/dL (ref 100–199)
HDL: 58 mg/dL (ref >39)
LDL Calculated: 86 mg/dL (ref 0–99)
Triglycerides: 75 mg/dL (ref 0–149)
VLDL CHOLESTEROL CAL: 15 mg/dL (ref 5–40)

## 2017-07-28 LAB — TSH+FREE T4
Free T4: 1.83 ng/dL — ABNORMAL HIGH (ref 0.82–1.77)
TSH: 0.14 u[IU]/mL — AB (ref 0.450–4.500)

## 2017-11-25 ENCOUNTER — Other Ambulatory Visit: Payer: Self-pay | Admitting: Obstetrics and Gynecology

## 2017-11-25 DIAGNOSIS — Z1231 Encounter for screening mammogram for malignant neoplasm of breast: Secondary | ICD-10-CM

## 2017-12-10 ENCOUNTER — Ambulatory Visit
Admission: RE | Admit: 2017-12-10 | Discharge: 2017-12-10 | Disposition: A | Discharge status: Home / Self Care | Payer: Managed Care, Other (non HMO) | Source: Ambulatory Visit | Attending: Obstetrics and Gynecology | Admitting: Obstetrics and Gynecology

## 2017-12-10 DIAGNOSIS — Z1231 Encounter for screening mammogram for malignant neoplasm of breast: Secondary | ICD-10-CM | POA: Diagnosis not present

## 2017-12-21 ENCOUNTER — Other Ambulatory Visit: Payer: Self-pay

## 2018-01-12 ENCOUNTER — Ambulatory Visit: Payer: Self-pay | Admitting: Family Medicine

## 2018-01-12 VITALS — BP 149/71 | HR 97 | Temp 97.9°F | Resp 18 | Ht 68.0 in | Wt 240.0 lb

## 2018-01-12 DIAGNOSIS — Z008 Encounter for other general examination: Secondary | ICD-10-CM

## 2018-01-12 DIAGNOSIS — Z0189 Encounter for other specified special examinations: Principal | ICD-10-CM

## 2018-01-12 DIAGNOSIS — E039 Hypothyroidism, unspecified: Secondary | ICD-10-CM

## 2018-01-12 LAB — GLUCOSE, POCT (MANUAL RESULT ENTRY): POC GLUCOSE: 235 mg/dL — AB (ref 70–99)

## 2018-01-12 NOTE — Progress Notes (Signed)
Subjective: Annual biometrics screening  Patient presents for her annual biometric screening. 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 Patient sees her primary care provider regularly. PCP: Dr. Greggory Stallion. Patient denies any other issues or concerns.   Review of Systems Unremarkable  Objective  Physical Exam General: Awake, alert and oriented. No acute distress. Well developed, hydrated and nourished. Appears stated age.  HEENT: Supple neck without adenopathy. Sclera is non-icteric. The ear canal is clear without discharge. The tympanic membrane is normal in appearance with normal landmarks and cone of light. Nasal mucosa is pink and moist. Oral mucosa is pink and moist. The pharynx is normal in appearance without tonsillar swelling or exudates.  Skin: Skin in warm, dry and intact without rashes or lesions. Appropriate color for ethnicity. Cardiac: Heart rate and rhythm are normal. No murmurs, gallops, or rubs are auscultated.  Respiratory: The chest wall is symmetric and without deformity. No signs of respiratory distress. Lung sounds are clear in all lobes bilaterally without rales, ronchi, or wheezes.  Neurological: The patient is awake, alert and oriented to person, place, and time with normal speech.  Memory is normal and thought processes intact. No gait abnormalities are appreciated.  Psychiatric: Appropriate mood and affect.   Assessment Annual biometrics screening  Plan  Lipid panel pending. Encouraged routine visits with primary care provider.  Fasting blood sugar is 235 today. Patient has a history of diabetes and wears an insulin pump.  Advised patient to follow-up with her primary care provider regarding this. Patient's blood pressure is 149/71 today.  Discussed normal values.  Advised patient to check her blood pressure regularly for the next few weeks and report abnormal values to  her primary care provider. Encouraged patient to get regular exercise and eat a healthy, well-rounded diet.

## 2018-01-13 LAB — LIPID PANEL
CHOL/HDL RATIO: 2.3 ratio (ref 0.0–4.4)
Cholesterol, Total: 187 mg/dL (ref 100–199)
HDL: 80 mg/dL (ref >39)
LDL CALC: 87 mg/dL (ref 0–99)
Triglycerides: 98 mg/dL (ref 0–149)
VLDL Cholesterol Cal: 20 mg/dL (ref 5–40)

## 2018-01-19 ENCOUNTER — Other Ambulatory Visit: Payer: Self-pay | Admitting: Family Medicine

## 2018-01-20 LAB — T4, FREE: Free T4: 1.46 ng/dL (ref 0.82–1.77)

## 2018-01-20 LAB — TSH: TSH: 3.13 u[IU]/mL (ref 0.450–4.500)

## 2018-01-20 LAB — POTASSIUM: Potassium: 4.1 mmol/L (ref 3.5–5.2)

## 2019-02-01 ENCOUNTER — Other Ambulatory Visit: Payer: Self-pay | Admitting: Obstetrics and Gynecology

## 2019-02-01 DIAGNOSIS — Z1231 Encounter for screening mammogram for malignant neoplasm of breast: Secondary | ICD-10-CM

## 2019-02-07 ENCOUNTER — Ambulatory Visit
Admission: RE | Admit: 2019-02-07 | Discharge: 2019-02-07 | Disposition: A | Discharge status: Home / Self Care | Payer: Managed Care, Other (non HMO) | Source: Ambulatory Visit | Attending: Obstetrics and Gynecology | Admitting: Obstetrics and Gynecology

## 2019-02-07 ENCOUNTER — Other Ambulatory Visit: Payer: Self-pay

## 2019-02-07 DIAGNOSIS — Z1231 Encounter for screening mammogram for malignant neoplasm of breast: Secondary | ICD-10-CM | POA: Insufficient documentation

## 2019-07-27 ENCOUNTER — Other Ambulatory Visit: Payer: Self-pay

## 2019-07-27 DIAGNOSIS — Z20822 Contact with and (suspected) exposure to covid-19: Secondary | ICD-10-CM

## 2019-07-31 LAB — NOVEL CORONAVIRUS, NAA: SARS-CoV-2, NAA: NOT DETECTED

## 2019-09-08 ENCOUNTER — Ambulatory Visit: Payer: Managed Care, Other (non HMO) | Admitting: Family

## 2019-09-08 ENCOUNTER — Encounter: Payer: Self-pay | Admitting: Family

## 2019-09-08 ENCOUNTER — Other Ambulatory Visit: Payer: Self-pay

## 2019-09-08 ENCOUNTER — Ambulatory Visit
Admission: RE | Admit: 2019-09-08 | Discharge: 2019-09-08 | Disposition: A | Discharge status: Home / Self Care | Payer: Managed Care, Other (non HMO) | Source: Ambulatory Visit | Attending: Family | Admitting: Family

## 2019-09-08 ENCOUNTER — Emergency Department
Admission: EM | Admit: 2019-09-08 | Discharge: 2019-09-08 | Disposition: A | Discharge status: Home / Self Care | Payer: Managed Care, Other (non HMO) | Attending: Emergency Medicine | Admitting: Emergency Medicine

## 2019-09-08 ENCOUNTER — Encounter: Payer: Self-pay | Admitting: Intensive Care

## 2019-09-08 VITALS — BP 152/78 | HR 90 | Temp 98.0°F | Resp 18 | Ht 68.0 in | Wt 249.0 lb

## 2019-09-08 DIAGNOSIS — K5732 Diverticulitis of large intestine without perforation or abscess without bleeding: Secondary | ICD-10-CM | POA: Insufficient documentation

## 2019-09-08 DIAGNOSIS — Z794 Long term (current) use of insulin: Secondary | ICD-10-CM | POA: Insufficient documentation

## 2019-09-08 DIAGNOSIS — R109 Unspecified abdominal pain: Secondary | ICD-10-CM

## 2019-09-08 DIAGNOSIS — R1012 Left upper quadrant pain: Secondary | ICD-10-CM | POA: Insufficient documentation

## 2019-09-08 DIAGNOSIS — R1032 Left lower quadrant pain: Secondary | ICD-10-CM | POA: Diagnosis present

## 2019-09-08 DIAGNOSIS — K5792 Diverticulitis of intestine, part unspecified, without perforation or abscess without bleeding: Secondary | ICD-10-CM

## 2019-09-08 DIAGNOSIS — I10 Essential (primary) hypertension: Secondary | ICD-10-CM | POA: Insufficient documentation

## 2019-09-08 DIAGNOSIS — Z79899 Other long term (current) drug therapy: Secondary | ICD-10-CM | POA: Diagnosis not present

## 2019-09-08 DIAGNOSIS — E119 Type 2 diabetes mellitus without complications: Secondary | ICD-10-CM | POA: Diagnosis not present

## 2019-09-08 DIAGNOSIS — K219 Gastro-esophageal reflux disease without esophagitis: Secondary | ICD-10-CM | POA: Insufficient documentation

## 2019-09-08 LAB — URINALYSIS, COMPLETE (UACMP) WITH MICROSCOPIC
Bacteria, UA: NONE SEEN
Bilirubin Urine: NEGATIVE
Glucose, UA: NEGATIVE mg/dL
Hgb urine dipstick: NEGATIVE
Ketones, ur: NEGATIVE mg/dL
Leukocytes,Ua: NEGATIVE
Nitrite: NEGATIVE
Protein, ur: 30 mg/dL — AB
Specific Gravity, Urine: 1.023 (ref 1.005–1.030)
pH: 8 (ref 5.0–8.0)

## 2019-09-08 LAB — COMPREHENSIVE METABOLIC PANEL
ALT: 23 U/L (ref 0–44)
AST: 25 U/L (ref 15–41)
Albumin: 3.8 g/dL (ref 3.5–5.0)
Alkaline Phosphatase: 80 U/L (ref 38–126)
Anion gap: 7 (ref 5–15)
BUN: 10 mg/dL (ref 6–20)
CO2: 27 mmol/L (ref 22–32)
Calcium: 8.8 mg/dL — ABNORMAL LOW (ref 8.9–10.3)
Chloride: 102 mmol/L (ref 98–111)
Creatinine, Ser: 0.71 mg/dL (ref 0.44–1.00)
GFR calc Af Amer: >60 mL/min (ref >60)
GFR calc non Af Amer: >60 mL/min (ref >60)
Glucose, Bld: 130 mg/dL — ABNORMAL HIGH (ref 70–99)
Potassium: 4.1 mmol/L (ref 3.5–5.1)
Sodium: 136 mmol/L (ref 135–145)
Total Bilirubin: 1 mg/dL (ref 0.3–1.2)
Total Protein: 7.4 g/dL (ref 6.5–8.1)

## 2019-09-08 LAB — CBC
HCT: 42.3 % (ref 36.0–46.0)
Hemoglobin: 14.4 g/dL (ref 12.0–15.0)
MCH: 32.1 pg (ref 26.0–34.0)
MCHC: 34 g/dL (ref 30.0–36.0)
MCV: 94.4 fL (ref 80.0–100.0)
Platelets: 202 10*3/uL (ref 150–400)
RBC: 4.48 MIL/uL (ref 3.87–5.11)
RDW: 11.8 % (ref 11.5–15.5)
WBC: 8.2 10*3/uL (ref 4.0–10.5)
nRBC: 0 % (ref 0.0–0.2)

## 2019-09-08 LAB — LIPASE, BLOOD: Lipase: 18 U/L (ref 11–51)

## 2019-09-08 MED ORDER — TRAMADOL HCL 50 MG PO TABS: 50.0000 mg | ORAL_TABLET | Freq: Four times a day (QID) | ORAL | 0 refills | Status: AC | PRN

## 2019-09-08 MED ORDER — CIPROFLOXACIN HCL 500 MG PO TABS: 500.0000 mg | ORAL_TABLET | Freq: Two times a day (BID) | ORAL | 0 refills | Status: AC

## 2019-09-08 MED ORDER — METRONIDAZOLE 500 MG PO TABS: 500.0000 mg | ORAL_TABLET | Freq: Three times a day (TID) | ORAL | 0 refills | Status: AC

## 2019-09-08 NOTE — Discharge Instructions (Signed)
Please seek medical attention for any high fevers, chest pain, shortness of breath, change in behavior, persistent vomiting, bloody stool or any other new or concerning symptoms.  

## 2019-09-08 NOTE — ED Triage Notes (Signed)
Patient sent by pcp for possible diverticulitis. C/o abd pain with nausea. HX diverticulitis. Ambulatory into triage

## 2019-09-08 NOTE — ED Provider Notes (Signed)
[facility] Emergency Department Provider Note  _______________________________________   I have reviewed the triage vital signs and the nursing notes.   HISTORY  Chief Complaint Abdominal Pain   History limited by: Not Limited   HPI Brandy Bryant is a 49 y.o. female who presents to the emergency department today from primary care office because of concern for left sided abdominal pain. The pain started 3 days ago. Located in the left lower quadrant it has started radiating up towards her left flank. The pain has been associated with some nausea. She denies any change in defecation. Denies any change in urination. The patient has not had any fevers. The patient has history of diverticulitis and states that this pain reminds her of the pain she had with diverticulitis. At PCPs office there was concern for possible acute abdomen and x-rays were taken. Patient was then sent to the emergency department.   Records reviewed. Per medical record review patient has a history of DM, HLD, HTN, diverticulitis.  Past Medical History:  Diagnosis Date  . Chicken pox   . Depression   . Diabetes mellitus without complication ([place])   . GERD (gastroesophageal reflux disease)   . Hyperlipidemia   . Hypertension   . Sleep apnea   . Thyroid disease     Patient Active Problem List   Diagnosis Date Noted  . Pelvic pain 05/31/2017  . Cervical disc disorder with radiculopathy of cervical region 01/22/2015  . Shoulder bursitis 11/12/2014  . Lateral epicondylitis 10/19/2014  . Nonallopathic lesion of lumbosacral region 09/10/2014  . Nonallopathic lesion of sacral region 09/10/2014  . Nonallopathic lesion of thoracic region 09/10/2014  . Clinical depression 08/01/2014  . BP (high blood pressure) 08/01/2014  . Right lumbar radiculopathy 06/06/2014  . ADD (attention deficit disorder) 03/26/2014  . Acid reflux 04/03/2011  . Essential (primary) hypertension 04/03/2011  . HLD  (hyperlipidemia) 04/03/2011  . Adult hypothyroidism 04/03/2011    Past Surgical History:  Procedure Laterality Date  . ANTERIOR CERVICAL DECOMP/DISCECTOMY FUSION    . CERVICAL DISC ARTHROPLASTY  2014  . CHOLECYSTECTOMY  2014  . CYSTOSCOPY  05/31/2017   Procedure: CYSTOSCOPY;  Surgeon: Benjaman Kindler, MD;  Location: [facility] ORS;  Service: Gynecology;;  . FOOT SURGERY Left    bone spur  . LAPAROSCOPIC BILATERAL SALPINGECTOMY Bilateral 05/31/2017   Procedure: LAPAROSCOPIC BILATERAL SALPINGECTOMY;  Surgeon: Benjaman Kindler, MD;  Location: [facility] ORS;  Service: Gynecology;  Laterality: Bilateral;  . 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 REPAIR    . LAPAROSCOPIC HYSTERECTOMY N/A 05/31/2017   Procedure: HYSTERECTOMY TOTAL LAPAROSCOPIC;  Surgeon: Benjaman Kindler, MD;  Location: [facility] ORS;  Service: Gynecology;  Laterality: N/A;  . NASAL SINUS SURGERY Right   . ROTATOR CUFF REPAIR Right 2013  . TUBAL LIGATION      Prior to Admission medications   Medication Sig Start Date End Date Taking? Authorizing Provider  Biotin 1000 MCG CHEW Chew 1 each by mouth daily.    [provider]  buPROPion (WELLBUTRIN XL) 300 MG 24 hr tablet Take 300 mg by mouth daily.  09/27/13   [provider]  calcium-vitamin D (OSCAL WITH D) 500-200 MG-UNIT tablet Take 1 tablet by mouth.    [provider]  Cyanocobalamin (RA VITAMIN B-12 TR) 1000 MCG TBCR Take 1 tablet by mouth daily.     [provider]  glucose blood (BAYER CONTOUR NEXT TEST) test strip use 8x/day; dx = 250.03; needs the  Contour next strips that  link with her meter 06/07/13   [provider]  ibuprofen (ADVIL,MOTRIN) 800 MG tablet Take 1 tablet (800 mg total) by mouth every 8 (eight) hours as needed for moderate pain. 05/31/17   [name], MD  Incontinence Supplies (BARD PROTECTIVE BARRIER FILM) MISC Use when changing insulin pump sites. All Kare barrier wipes is what she prefers 12/23/12    [provider]  insulin lispro (HUMALOG) 100 UNIT/ML injection use in pump up to 100 units daily 12/05/13   [provider]  levothyroxine (SYNTHROID, LEVOTHROID) 175 MCG tablet Take 175 mcg by mouth See admin instructions. one po daily on 6 days a week, and on the 7th day half tablet 06/07/13   [provider]  lisdexamfetamine (VYVANSE) 50 MG capsule Take 50 mg by mouth daily.    [provider]  losartan (COZAAR) 25 MG tablet Take 12.5 mg by mouth daily.    [provider]  methylphenidate (RITALIN) 5 MG tablet Take 5 mg by mouth 2 (two) times daily.    [provider]  Multiple Vitamin (MULTI-VITAMINS) TABS Take 1 tablet by mouth daily.  10/29/08   [provider]  nitrofurantoin (MACRODANTIN) 50 MG capsule 1 po after sexual activity Patient taking differently: Take 50 mg by mouth See admin instructions. 1 po after sexual activity 12/01/16   Sherrie Mustache Roselyn Bering, PA-C  pravastatin (PRAVACHOL) 80 MG tablet Take 80 mg by mouth every morning.  07/10/16 07/10/17  [provider]  sertraline (ZOLOFT) 100 MG tablet Take 150 mg by mouth daily.    [provider]  valACYclovir (VALTREX) 500 MG tablet Take 500 mg by mouth daily.    [provider]    Allergies Adhesive [tape], Codeine, Oxycodone, Penicillins, Valium [diazepam], and Vancomycin  Family History  Problem Relation Age of Onset  . Hyperlipidemia Mother   . Hypertension Mother   . Hyperlipidemia Father   . Hypertension Father     Social History Social History   Tobacco Use  . Smoking status: Former Games developer  . Smokeless tobacco: Never Used  Substance Use Topics  . Alcohol use: Yes    Alcohol/week: 4.0 standard drinks    Types: 4 Shots of liquor per week  . Drug use: No    Review of Systems Constitutional: No fever/chills Eyes: No visual changes. ENT: No sore throat. Cardiovascular: Denies chest pain. Respiratory: Denies shortness of  breath. Gastrointestinal: Positive for left sided abdominal pain, nausea.  Genitourinary: Negative for dysuria. Musculoskeletal: Negative for back pain. Skin: Negative for rash. Neurological: Negative for headaches, focal weakness or numbness.  ____________________________________________   PHYSICAL EXAM:  VITAL SIGNS: ED Triage Vitals [09/08/19 1258]  Enc Vitals Group     BP 121/61     Pulse Rate 87     Resp 16     Temp 98.6 F (37 C)     Temp Source Oral     SpO2 99 %     Weight 245 lb (111.1 kg)     Height 5\' 8"  (1.727 m)     Head Circumference      Peak Flow      Pain Score 4   Constitutional: Alert and oriented.  Eyes: Conjunctivae are normal.  ENT      Head: Normocephalic and atraumatic.      Nose: No congestion/rhinnorhea.      Mouth/Throat: Mucous membranes are moist.      Neck: No stridor. Hematological/Lymphatic/Immunilogical: No cervical lymphadenopathy. Cardiovascular: Normal rate, regular rhythm.  No murmurs, rubs, or gallops.  Respiratory: Normal respiratory effort without tachypnea nor retractions. Breath sounds are clear and equal bilaterally. No wheezes/rales/rhonchi. Gastrointestinal: Soft and tender to palpation in the left side of the abdomen, worse in LLQ. Negative rebound. Negative guarding.  Genitourinary: Deferred Musculoskeletal: Normal range of motion in all extremities. No lower extremity edema. Neurologic:  Normal speech and language. No gross focal neurologic deficits are appreciated.  Skin:  Skin is warm, dry and intact. No rash noted. Psychiatric: Mood and affect are normal. Speech and behavior are normal. Patient exhibits appropriate insight and judgment.  ____________________________________________    LABS (pertinent positives/negatives)  CBC wbc 8.2, hgb 14.4, plt 202 Lipase 18 CMP wnl except glu 130, ca 8.8 ____________________________________________   EKG  None  ____________________________________________     RADIOLOGY  None  ____________________________________________   PROCEDURES  Procedures  ____________________________________________   INITIAL IMPRESSION / ASSESSMENT AND PLAN / ED COURSE  Pertinent labs & imaging results that were available during my care of the patient were reviewed by me and considered in my medical decision making (see chart for details).   Patient presented to the emergency department today because of concern for abdominal pain. On exam does have tenderness to the left lower quadrant, but abdomen was soft and no guarding or rebound on my exam. At this time do not think patient has acute abdomen. Patient is afebrile, no leukocytosis. Discussed with patient that it is possible that she has diverticulitis again. CT scan of the abdomen/pelvis was offered however patient felt comfortable deferring at this time which I think is completely reasonable. Will plan on treating empirically for diverticulitis with antibiotics and pain medication. Discussed return precautions with the patient.   ____________________________________________   FINAL CLINICAL IMPRESSION(S) / ED DIAGNOSES  Final diagnoses:  Abdominal pain, unspecified abdominal location  Diverticulitis     Note: This dictation was prepared with Dragon dictation. Any transcriptional errors that result from this process are unintentional     Phineas Semen, MD 09/08/19 1402

## 2019-09-08 NOTE — Progress Notes (Signed)
Subjective:     Brandy Bryant is a 49 y.o. female who presents for evaluation of abdominal pain. Onset was 3 days ago. Symptoms have been gradually worsening. The pain is described as sharp, and is 8/10 in intensity. Pain is located in the LUQ with radiation to left back.  Aggravating factors: movement and palpation.  Alleviating factors: none. Associated symptoms: nausea. The patient denies diarrhea and vomiting.  The patient's history has been marked as reviewed and updated as appropriate.  Review of Systems Pertinent items are noted in HPI. noted that patient has history of hospitalization for divertiulitis in the past     Objective:    Abdomen: abnormal findings:  guarding and rebound tenderness to left upper and lower quad   Assessment:    Abdominal pain, likely secondary to acute abdomen . has history of diverticulitis.     Plan:    refer to ED for possible acute abdomen    Spoke with Dr Sullivan Lone who feels she should be seen in the ED due to limitations of testing in this clinic. Explained to patient my concerns for possible acute abdomen and she verbalized understanding.  She has agreed to be seen at the ED in [facility]

## 2019-12-20 ENCOUNTER — Other Ambulatory Visit: Payer: Self-pay | Admitting: Obstetrics and Gynecology

## 2019-12-20 DIAGNOSIS — Z1231 Encounter for screening mammogram for malignant neoplasm of breast: Secondary | ICD-10-CM

## 2020-02-08 ENCOUNTER — Ambulatory Visit
Admission: RE | Admit: 2020-02-08 | Discharge: 2020-02-08 | Disposition: A | Discharge status: Home / Self Care | Payer: Managed Care, Other (non HMO) | Source: Ambulatory Visit | Attending: Obstetrics and Gynecology | Admitting: Obstetrics and Gynecology

## 2020-02-08 DIAGNOSIS — Z1231 Encounter for screening mammogram for malignant neoplasm of breast: Secondary | ICD-10-CM | POA: Insufficient documentation

## 2020-05-06 ENCOUNTER — Other Ambulatory Visit: Payer: Self-pay

## 2020-05-06 ENCOUNTER — Ambulatory Visit: Payer: Managed Care, Other (non HMO) | Admitting: Physician Assistant

## 2020-05-06 VITALS — BP 124/68 | HR 90 | Temp 97.6°F | Resp 18 | Ht 68.0 in | Wt 235.0 lb

## 2020-05-06 DIAGNOSIS — Z0000 Encounter for general adult medical examination without abnormal findings: Secondary | ICD-10-CM | POA: Diagnosis not present

## 2020-05-06 DIAGNOSIS — Z008 Encounter for other general examination: Secondary | ICD-10-CM

## 2020-05-06 NOTE — Progress Notes (Signed)
   Subjective:    Patient ID: Brandy Bryant, female    DOB: 04/10/1971, 49 y.o.   MRN: [id number]  HPI  Patient is a 49 year old female who presents to the clinic today for biometric screening and examination. 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  she states that she enjoys her job. She is not having any issues with increased stress or burnout at this time. She has no new  medical problems. She is followed by endocrinology for her diabetes and her primary physician for obesity. She said her latest hemoglobin A1C  was 7.3. This is the usual range of her A1C. She says that she has been doing a reasonably good job at following her diet. She gets exercise from chasing her toddler. She does not smoke. She uses alcohol occasionally . She denies the use of any street or recreational drugs. She is up to date on her immunizations. She has received her covid vaccines.     Review of Systems See HPI, otherwise negative.    Objective:   Physical Exam Vital signs and nurses notes have been reviewed.  General: patient is awake and alert in no distress.  Head normocephalic, here's the tympanic membranes are visualized. The canals are clear. As, the extraocular movements are intact, the conjunctiva is clear.  Mouth and throat, clear. The speech is understandable. Weston Brass, no palpable thyroid enlargement. Full range of motion noted of the neck. Heart, regular rate and rhythm. No [place], murmur , or rub. Lungs, symmetrical rise and fall of the chest. Patient speaks in complete sentences. Lungs are clear. Musculoskeletal, good range of motion of upper and lower extremities. The patient has sustained a bite on the posterior portion of the right knee . The area is raised and warm . No red streaks appreciated. Neurologic, no gross neurologic deficits appreciated. Gate is steady. No motor or sensory deficit appreciated.        Assessment & Plan:       Plan:  Discussed need  for consistent exercise program. Discussed need to continue diabetic and low cholesterol diet. Pt to return in 1 year.

## 2020-05-06 NOTE — Patient Instructions (Signed)
Please schedule a consistent exercise program. Please continue diabetic and low cholesterol diet. Please schedule some personal time to rest and exhale. Please follow the bite to your lower extremity with your primary physician. Return to this clinic in one year.

## 2020-05-07 LAB — LIPID PANEL
Chol/HDL Ratio: 2.7 ratio (ref 0.0–4.4)
Cholesterol, Total: 161 mg/dL (ref 100–199)
HDL: 60 mg/dL (ref >39)
LDL Chol Calc (NIH): 87 mg/dL (ref 0–99)
Triglycerides: 70 mg/dL (ref 0–149)
VLDL Cholesterol Cal: 14 mg/dL (ref 5–40)

## 2020-05-07 LAB — GLUCOSE, RANDOM: Glucose: 227 mg/dL — ABNORMAL HIGH (ref 65–99)

## 2020-09-06 DIAGNOSIS — S62306A Unspecified fracture of fifth metacarpal bone, right hand, initial encounter for closed fracture: Secondary | ICD-10-CM | POA: Insufficient documentation

## 2020-10-24 ENCOUNTER — Emergency Department: Payer: Managed Care, Other (non HMO)

## 2020-10-24 ENCOUNTER — Emergency Department
Admission: EM | Admit: 2020-10-24 | Discharge: 2020-10-24 | Disposition: A | Discharge status: Home / Self Care | Payer: Managed Care, Other (non HMO) | Attending: Emergency Medicine | Admitting: Emergency Medicine

## 2020-10-24 ENCOUNTER — Encounter: Payer: Self-pay | Admitting: Emergency Medicine

## 2020-10-24 ENCOUNTER — Other Ambulatory Visit: Payer: Self-pay

## 2020-10-24 DIAGNOSIS — E039 Hypothyroidism, unspecified: Secondary | ICD-10-CM | POA: Insufficient documentation

## 2020-10-24 DIAGNOSIS — I10 Essential (primary) hypertension: Secondary | ICD-10-CM | POA: Insufficient documentation

## 2020-10-24 DIAGNOSIS — E119 Type 2 diabetes mellitus without complications: Secondary | ICD-10-CM | POA: Diagnosis not present

## 2020-10-24 DIAGNOSIS — R1032 Left lower quadrant pain: Secondary | ICD-10-CM | POA: Insufficient documentation

## 2020-10-24 DIAGNOSIS — R110 Nausea: Secondary | ICD-10-CM | POA: Insufficient documentation

## 2020-10-24 DIAGNOSIS — R109 Unspecified abdominal pain: Secondary | ICD-10-CM

## 2020-10-24 DIAGNOSIS — Z794 Long term (current) use of insulin: Secondary | ICD-10-CM | POA: Insufficient documentation

## 2020-10-24 DIAGNOSIS — Z87891 Personal history of nicotine dependence: Secondary | ICD-10-CM | POA: Insufficient documentation

## 2020-10-24 DIAGNOSIS — R140 Abdominal distension (gaseous): Secondary | ICD-10-CM | POA: Insufficient documentation

## 2020-10-24 DIAGNOSIS — Z79899 Other long term (current) drug therapy: Secondary | ICD-10-CM | POA: Diagnosis not present

## 2020-10-24 HISTORY — DX: Diverticulitis of intestine, part unspecified, without perforation or abscess without bleeding: K57.92

## 2020-10-24 LAB — COMPREHENSIVE METABOLIC PANEL
ALT: 15 U/L (ref 0–44)
AST: 18 U/L (ref 15–41)
Albumin: 3.7 g/dL (ref 3.5–5.0)
Alkaline Phosphatase: 70 U/L (ref 38–126)
Anion gap: 8 (ref 5–15)
BUN: 9 mg/dL (ref 6–20)
CO2: 25 mmol/L (ref 22–32)
Calcium: 9.4 mg/dL (ref 8.9–10.3)
Chloride: 104 mmol/L (ref 98–111)
Creatinine, Ser: 0.68 mg/dL (ref 0.44–1.00)
GFR, Estimated: >60 mL/min (ref >60)
Glucose, Bld: 216 mg/dL — ABNORMAL HIGH (ref 70–99)
Potassium: 4.5 mmol/L (ref 3.5–5.1)
Sodium: 137 mmol/L (ref 135–145)
Total Bilirubin: 1 mg/dL (ref 0.3–1.2)
Total Protein: 7.2 g/dL (ref 6.5–8.1)

## 2020-10-24 LAB — CBC
HCT: 41.4 % (ref 36.0–46.0)
Hemoglobin: 14.2 g/dL (ref 12.0–15.0)
MCH: 32.4 pg (ref 26.0–34.0)
MCHC: 34.3 g/dL (ref 30.0–36.0)
MCV: 94.5 fL (ref 80.0–100.0)
Platelets: 186 10*3/uL (ref 150–400)
RBC: 4.38 MIL/uL (ref 3.87–5.11)
RDW: 11.4 % — ABNORMAL LOW (ref 11.5–15.5)
WBC: 6.9 10*3/uL (ref 4.0–10.5)
nRBC: 0 % (ref 0.0–0.2)

## 2020-10-24 LAB — URINALYSIS, COMPLETE (UACMP) WITH MICROSCOPIC
Bacteria, UA: NONE SEEN
Bilirubin Urine: NEGATIVE
Glucose, UA: >=500 mg/dL — AB
Hgb urine dipstick: NEGATIVE
Ketones, ur: NEGATIVE mg/dL
Leukocytes,Ua: NEGATIVE
Nitrite: NEGATIVE
Protein, ur: NEGATIVE mg/dL
Specific Gravity, Urine: 1.016 (ref 1.005–1.030)
WBC, UA: NONE SEEN WBC/hpf (ref 0–5)
pH: 9 — ABNORMAL HIGH (ref 5.0–8.0)

## 2020-10-24 LAB — LIPASE, BLOOD: Lipase: 26 U/L (ref 11–51)

## 2020-10-24 MED ORDER — DICYCLOMINE HCL 10 MG PO CAPS: 10.0000 mg | ORAL_CAPSULE | Freq: Four times a day (QID) | ORAL | 0 refills | Status: DC

## 2020-10-24 MED ORDER — IOHEXOL 300 MG/ML  SOLN
125.0000 mL | Freq: Once | INTRAMUSCULAR | Status: AC | PRN
  Administered 2020-10-24: 125 mL via INTRAVENOUS

## 2020-10-24 MED ORDER — ONDANSETRON 4 MG PO TBDP: 4.0000 mg | ORAL_TABLET | Freq: Three times a day (TID) | ORAL | 0 refills | Status: AC | PRN

## 2020-10-24 MED ORDER — ONDANSETRON 4 MG PO TBDP: 4.0000 mg | ORAL_TABLET | Freq: Three times a day (TID) | ORAL | 0 refills | Status: DC | PRN

## 2020-10-24 NOTE — Discharge Instructions (Signed)
Take Tylenol 1 g every 8 hours and ibuprofen 600 every 6 hours with food.  Take the Bentyl with meals and at nighttime to help with bloating, the Zofran to help with nausea.  Follow with your primary care doctor if your symptoms are not resolved in the next few days.  Return the ER for worsening pain, fevers or any other concern

## 2020-10-24 NOTE — ED Triage Notes (Signed)
Left side abd pain started yesterday.  Feels like diverticulitis.  No fever.  Called her doctor and they told her she needed to come to ED.

## 2020-10-24 NOTE — ED Provider Notes (Signed)
[facility] Emergency Department Provider Note  ____________________________________________   Event Date/Time   First MD Initiated Contact with Patient 10/24/20 1155     (approximate)  I have reviewed the triage vital signs and the nursing notes.   HISTORY  Chief Complaint Abdominal Pain    HPI Brandy ForthJulia Click [place] Bryant is a 50 y.o. female with diabetes, diverticulitis, hypertension, hyperlipidemia who comes in with abdominal pain.  Patient reports having left lower quadrant pain that started yesterday, moderate, constant, nothing makes it better, nothing makes it worse.  States that she has a little bit of pain that radiates to the right side and she feels bloated.  She denies any fevers.  Has had some nausea.  Patient reports a history of diverticulitis for 5 years ago where she needed to be hospitalized.  Since then she is had about a diverticulitis flare once a year but is never had repeat imaging since then.  Reports having colonoscopy after the first episode of diverticulitis.  Denies any urinary symptoms, CVA tenderness, chest pain, shortness of breath.       Past Medical History:  Diagnosis Date  . Chicken pox   . Depression   . Diabetes mellitus without complication (HCC)   . Diverticulitis   . GERD (gastroesophageal reflux disease)   . Hyperlipidemia   . Hypertension   . Sleep apnea   . Thyroid disease     Patient Active Problem List   Diagnosis Date Noted  . Pelvic pain 05/31/2017  . Cervical disc disorder with radiculopathy of cervical region 01/22/2015  . Shoulder bursitis 11/12/2014  . Lateral epicondylitis 10/19/2014  . Nonallopathic lesion of lumbosacral region 09/10/2014  . Nonallopathic lesion of sacral region 09/10/2014  . Nonallopathic lesion of thoracic region 09/10/2014  . Clinical depression 08/01/2014  . BP (high blood pressure) 08/01/2014  . Right lumbar radiculopathy 06/06/2014  . ADD (attention deficit disorder) 03/26/2014   . Acid reflux 04/03/2011  . Essential (primary) hypertension 04/03/2011  . HLD (hyperlipidemia) 04/03/2011  . Adult hypothyroidism 04/03/2011    Past Surgical History:  Procedure Laterality Date  . ANTERIOR CERVICAL DECOMP/DISCECTOMY FUSION    . CERVICAL DISC ARTHROPLASTY  2014  . CHOLECYSTECTOMY  2014  . CYSTOSCOPY  05/31/2017   Procedure: CYSTOSCOPY;  Surgeon: Christeen DouglasBeasley, Bethany, MD;  Location: [facility] ORS;  Service: Gynecology;;  . FOOT SURGERY Left    bone spur  . LAPAROSCOPIC BILATERAL SALPINGECTOMY Bilateral 05/31/2017   Procedure: LAPAROSCOPIC BILATERAL SALPINGECTOMY;  Surgeon: Christeen DouglasBeasley, Bethany, MD;  Location: [facility] ORS;  Service: Gynecology;  Laterality: Bilateral;  . 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 REPAIR    . LAPAROSCOPIC HYSTERECTOMY N/A 05/31/2017   Procedure: HYSTERECTOMY TOTAL LAPAROSCOPIC;  Surgeon: Christeen DouglasBeasley, Bethany, MD;  Location: [facility] ORS;  Service: Gynecology;  Laterality: N/A;  . NASAL SINUS SURGERY Right   . ROTATOR CUFF REPAIR Right 2013  . TUBAL LIGATION      Prior to Admission medications   Medication Sig Start Date End Date Taking? Authorizing Provider  Biotin 1000 MCG CHEW Chew 1 each by mouth daily.    [provider]  buPROPion (WELLBUTRIN XL) 300 MG 24 hr tablet Take 300 mg by mouth daily.  09/27/13   [provider]  calcium-vitamin D (OSCAL WITH D) 500-200 MG-UNIT tablet Take 1 tablet by mouth.    [provider]  Cyanocobalamin (RA VITAMIN B-12 TR) 1000 MCG TBCR Take 1 tablet by mouth daily.     [provider]  glucose  blood (BAYER CONTOUR NEXT TEST) test strip use 8x/day; dx = 250.03; needs the  Contour next strips that link with her meter 06/07/13   [provider]  ibuprofen (ADVIL,MOTRIN) 800 MG tablet Take 1 tablet (800 mg total) by mouth every 8 (eight) hours as needed for moderate pain. 05/31/17   [name], MD  Incontinence Supplies (BARD PROTECTIVE BARRIER FILM) MISC Use when  changing insulin pump sites. All Kare barrier wipes is what she prefers 12/23/12   [provider]  insulin lispro (HUMALOG) 100 UNIT/ML injection use in pump up to 100 units daily 12/05/13   [provider]  levothyroxine (SYNTHROID, LEVOTHROID) 175 MCG tablet Take 175 mcg by mouth See admin instructions. one po daily on 6 days a week, and on the 7th day half tablet 06/07/13   [provider]  lisdexamfetamine (VYVANSE) 50 MG capsule Take 50 mg by mouth daily.    [provider]  losartan (COZAAR) 25 MG tablet Take 12.5 mg by mouth daily.    [provider]  methylphenidate (RITALIN) 5 MG tablet Take 5 mg by mouth 2 (two) times daily.    [provider]  Multiple Vitamin (MULTI-VITAMINS) TABS Take 1 tablet by mouth daily.  10/29/08   [provider]  nitrofurantoin (MACRODANTIN) 50 MG capsule 1 po after sexual activity Patient not taking: Reported on 05/06/2020 12/01/16   Faythe Ghee, PA-C  pravastatin (PRAVACHOL) 80 MG tablet Take 80 mg by mouth every morning.  07/10/16 07/10/17  [provider]  sertraline (ZOLOFT) 100 MG tablet Take 150 mg by mouth daily. Patient not taking: Reported on 05/06/2020    [provider]  valACYclovir (VALTREX) 500 MG tablet Take 500 mg by mouth daily.    [provider]    Allergies Adhesive [tape], Codeine, Oxycodone, Penicillins, Valium [diazepam], and Vancomycin  Family History  Problem Relation Age of Onset  . Hyperlipidemia Mother   . Hypertension Mother   . Hyperlipidemia Father   . Hypertension Father   . Breast cancer Neg Hx     Social History Social History   Tobacco Use  . Smoking status: Former Games developer  . Smokeless tobacco: Never Used  Vaping Use  . Vaping Use: Never used  Substance Use Topics  . Alcohol use: Yes    Alcohol/week: 4.0 standard drinks    Types: 4 Shots of liquor per week  . Drug use: No      Review of Systems Constitutional: No  fever/chills Eyes: No visual changes. ENT: No sore throat. Cardiovascular: Denies chest pain. Respiratory: Denies shortness of breath. Gastrointestinal: Positive abdominal pain and nausea Genitourinary: Negative for dysuria. Musculoskeletal: Negative for back pain. Skin: Negative for rash. Neurological: Negative for headaches, focal weakness or numbness. All other ROS negative ____________________________________________   PHYSICAL EXAM:  VITAL SIGNS: ED Triage Vitals [10/24/20 0914]  Enc Vitals Group     BP 135/71     Pulse Rate 78     Resp 14     Temp 98.2 F (36.8 C)     Temp Source Oral     SpO2 100 %     Weight 238 lb (108 kg)     Height 5\' 8"  (1.727 m)     Head Circumference      Peak Flow      Pain Score 5     Pain Loc      Pain Edu?      Excl. in GC?  Constitutional: Alert and oriented. Well appearing and in no acute distress. Eyes: Conjunctivae are normal. EOMI. Head: Atraumatic. Nose: No congestion/rhinnorhea. Mouth/Throat: Mucous membranes are moist.   Neck: No stridor. Trachea Midline. FROM Cardiovascular: Normal rate, regular rhythm. Grossly normal heart sounds.  Good peripheral circulation. Respiratory: Normal respiratory effort.  No retractions. Lungs CTAB. Gastrointestinal: Tender in the lower abdomen mostly on the left side no distention. No abdominal bruits.  Musculoskeletal: No lower extremity tenderness nor edema.  No joint effusions. Neurologic:  Normal speech and language. No gross focal neurologic deficits are appreciated.  Skin:  Skin is warm, dry and intact. No rash noted. Psychiatric: Mood and affect are normal. Speech and behavior are normal. GU: Deferred   ____________________________________________   LABS (all labs ordered are listed, but only abnormal results are displayed)  Labs Reviewed  COMPREHENSIVE METABOLIC PANEL - Abnormal; Notable for the following components:      Result Value   Glucose, Bld 216 (*)    All other  components within normal limits  CBC - Abnormal; Notable for the following components:   RDW 11.4 (*)    All other components within normal limits  URINALYSIS, COMPLETE (UACMP) WITH MICROSCOPIC - Abnormal; Notable for the following components:   Color, Urine YELLOW (*)    APPearance CLOUDY (*)    pH 9.0 (*)    Glucose, UA >=500 (*)    All other components within normal limits  LIPASE, BLOOD   ____________________________________________  RADIOLOGY Vela Prose, personally viewed and evaluated these images (plain radiographs) as part of my medical decision making, as well as reviewing the written report by the radiologist.  ED MD interpretation: Reviewed CT and no acute abnormality  Official radiology report(s): CT ABDOMEN PELVIS W CONTRAST  Result Date: 10/24/2020 CLINICAL DATA:  Onset left side abdominal pain yesterday. Question diverticulitis. EXAM: CT ABDOMEN AND PELVIS WITH CONTRAST TECHNIQUE: Multidetector CT imaging of the abdomen and pelvis was performed using the standard protocol following bolus administration of intravenous contrast. CONTRAST:  125 mL OMNIPAQUE IOHEXOL 300 MG/ML  SOLN COMPARISON:  CT abdomen and pelvis 12/05/2013. FINDINGS: Lower chest: Lung bases clear.  No pleural or pericardial effusion. Hepatobiliary: No focal liver abnormality is seen. Status post cholecystectomy. No biliary dilatation. Pancreas: Unremarkable. No pancreatic ductal dilatation or surrounding inflammatory changes. Spleen: Normal in size without focal abnormality. Adrenals/Urinary Tract: Adrenal glands are unremarkable. Kidneys are normal, without renal calculi, focal lesion, or hydronephrosis. Bladder is unremarkable. Stomach/Bowel: The patient is status post gastric sleeve procedure. The stomach is otherwise unremarkable. Appendix appears normal. No evidence of bowel wall thickening, distention, or inflammatory changes. Vascular/Lymphatic: Aortic atherosclerosis. No enlarged abdominal or pelvic  lymph nodes. Reproductive: Status post hysterectomy. No adnexal masses. Other: None. Musculoskeletal: No acute or focal abnormality. IMPRESSION: Negative for diverticulitis. No acute abnormality or finding to explain the patient's symptoms. Status post hysterectomy and gastric sleeve procedure. Aortic Atherosclerosis (ICD10-I70.0). Electronically Signed   By: Drusilla Kanner M.D.   On: 10/24/2020 13:14    ____________________________________________   PROCEDURES  Procedure(s) performed (including Critical Care):  Procedures   ____________________________________________   INITIAL IMPRESSION / ASSESSMENT AND PLAN / ED COURSE  Brandy Forth Penman was evaluated in Emergency Department on 10/24/2020 for the symptoms described in the history of present illness. She was evaluated in the context of the global COVID-19 pandemic, which necessitated consideration that the patient might be at risk for infection with the SARS-CoV-2 virus that causes COVID-19. Institutional protocols and algorithms that pertain  to the evaluation of patients at risk for COVID-19 are in a state of rapid change based on information released by regulatory bodies including the CDC and federal and state organizations. These policies and algorithms were followed during the patient's care in the ED.    Patient is a well-appearing 50 year old who comes in with left lower quadrant pain with a history of diverticulitis.  Patient has not had any recent imaging in the past 5 years.  We discussed different options including repeat imaging to rule out any complications such as abscess, perforation, appendicitis.  Will get urine to evaluate for UTI but denies any symptoms.  Low suspicion for kidney stone.  After discussion about repeat imaging versus treatment prophylactically for diverticulitis patient like to proceed with CT scan given no recent imaging.  Patient declined pain medicine and nausea medicine at this time  Labs are  reassuring.  UA is negative for UTI.  CT scan is negative but she is status post hysterectomy and gastric sleeve.  Reevaluated patient updated on results.  Potentially she could have some irritable bowel syndrome given the bloating.  We will trial her on some Bentyl, Tylenol, ibuprofen, Zofran.  Encourage patient to follow-up with her primary care doctor if symptoms are not resolving consider could consider a course of antibiotics for diverticulitis then but at this time there is no evidence of diverticulitis on CT       ____________________________________________   FINAL CLINICAL IMPRESSION(S) / ED DIAGNOSES   Final diagnoses:  Abdominal pain, unspecified abdominal location  Bloating symptom      MEDICATIONS GIVEN DURING THIS VISIT:  Medications  iohexol (OMNIPAQUE) 300 MG/ML solution 125 mL (125 mLs Intravenous Contrast Given 10/24/20 1236)     ED Discharge Orders         Ordered    dicyclomine (BENTYL) 10 MG capsule  4 times daily        10/24/20 1342    ondansetron (ZOFRAN ODT) 4 MG disintegrating tablet  Every 8 hours PRN        10/24/20 1342           Note:  This document was prepared using Dragon voice recognition software and may include unintentional dictation errors.   Concha Se, MD 10/24/20 1345

## 2020-10-24 NOTE — ED Notes (Signed)
Pt verbalized understanding of d/c instructions at this time. Pt denied further questions. Pt ambulatory to lobby at this time, NAD noted, steady gait noted, RR even and unlabored at this time.

## 2020-12-24 ENCOUNTER — Other Ambulatory Visit: Payer: Self-pay | Admitting: Obstetrics and Gynecology

## 2020-12-24 DIAGNOSIS — Z1231 Encounter for screening mammogram for malignant neoplasm of breast: Secondary | ICD-10-CM

## 2021-01-13 ENCOUNTER — Other Ambulatory Visit: Payer: Self-pay

## 2021-01-13 ENCOUNTER — Other Ambulatory Visit: Payer: Managed Care, Other (non HMO)

## 2021-01-15 ENCOUNTER — Other Ambulatory Visit: Payer: Self-pay

## 2021-01-18 LAB — ZINC: Zinc: 62 ug/dL (ref 44–115)

## 2021-01-18 LAB — COPPER, SERUM: Copper: 98 ug/dL (ref 80–158)

## 2021-01-20 DIAGNOSIS — Z9884 Bariatric surgery status: Secondary | ICD-10-CM | POA: Insufficient documentation

## 2021-02-03 ENCOUNTER — Other Ambulatory Visit: Payer: Self-pay | Admitting: Specialist

## 2021-02-10 ENCOUNTER — Other Ambulatory Visit: Payer: Self-pay

## 2021-02-10 ENCOUNTER — Ambulatory Visit
Admission: RE | Admit: 2021-02-10 | Discharge: 2021-02-10 | Disposition: A | Discharge status: Home / Self Care | Payer: Managed Care, Other (non HMO) | Source: Ambulatory Visit | Attending: Obstetrics and Gynecology | Admitting: Obstetrics and Gynecology

## 2021-02-10 DIAGNOSIS — Z1231 Encounter for screening mammogram for malignant neoplasm of breast: Secondary | ICD-10-CM | POA: Diagnosis present

## 2021-02-11 LAB — LIPID PANEL W/O CHOL/HDL RATIO
Cholesterol, Total: 198 mg/dL (ref 100–199)
HDL: 83 mg/dL (ref >39)
LDL Chol Calc (NIH): 86 mg/dL (ref 0–99)
Triglycerides: 176 mg/dL — ABNORMAL HIGH (ref 0–149)
VLDL Cholesterol Cal: 29 mg/dL (ref 5–40)

## 2021-02-11 LAB — SYMPATHOMIMETICS,MS,WB/SP RFX
Amphetamine: 48 ng/mL
Diethylpropion: NEGATIVE ng/mL
Ephedrine: NEGATIVE ng/mL
MDA: NEGATIVE ng/mL
MDEA: NEGATIVE ng/mL
MDMA: NEGATIVE ng/mL
Methamphetamine: NEGATIVE ng/mL
Phendimetrazine: NEGATIVE ng/mL
Phentermine: NEGATIVE ng/mL
Phenylpropanolamine: NEGATIVE ng/mL
Pseudoephedrine: NEGATIVE ng/mL
Sympathomimetics Confirmation: POSITIVE

## 2021-02-11 LAB — COMPREHENSIVE METABOLIC PANEL
ALT: 14 IU/L (ref 0–32)
AST: 15 IU/L (ref 0–40)
Albumin/Globulin Ratio: 1.5 (ref 1.2–2.2)
Albumin: 4.1 g/dL (ref 3.8–4.8)
Alkaline Phosphatase: 91 IU/L (ref 44–121)
BUN/Creatinine Ratio: 16 (ref 9–23)
BUN: 13 mg/dL (ref 6–24)
Bilirubin Total: 0.7 mg/dL (ref 0.0–1.2)
CO2: 25 mmol/L (ref 20–29)
Calcium: 9.3 mg/dL (ref 8.7–10.2)
Chloride: 98 mmol/L (ref 96–106)
Creatinine, Ser: 0.81 mg/dL (ref 0.57–1.00)
Globulin, Total: 2.7 g/dL (ref 1.5–4.5)
Glucose: 235 mg/dL — ABNORMAL HIGH (ref 65–99)
Potassium: 4.7 mmol/L (ref 3.5–5.2)
Sodium: 137 mmol/L (ref 134–144)
Total Protein: 6.8 g/dL (ref 6.0–8.5)
eGFR: 89 mL/min/{1.73_m2} (ref >59)

## 2021-02-11 LAB — COMP BLD DRUG SCR W/MR-THC
Acetaminophen, IA: NEGATIVE ug/mL
Amphetamines, IA: POSITIVE ng/mL — AB
Barbiturates, IA: NEGATIVE ug/mL
Benzodiazepines, IA: NEGATIVE ng/mL
Buprenorphine, IA: NEGATIVE ng/mL
Carisoprodol, IA: NEGATIVE ug/mL
Cocaine/Metabolite, IA: NEGATIVE ng/mL
Ethyl Alcohol, Enz: NEGATIVE gm/dL
Fentanyl, IA: NEGATIVE ng/mL
Gabapentin, IA: NEGATIVE ug/mL
Meperidine, IA: NEGATIVE ng/mL
Methadone, IA: NEGATIVE ng/mL
Opiates, IA: NEGATIVE ng/mL
Other Drugs Detected: POSITIVE
Oxycodones, IA: NEGATIVE ng/mL
Phencyclidine, IA: NEGATIVE ng/mL
Propoxyphene, IA: NEGATIVE ng/mL
Tramadol, IA: NEGATIVE ng/mL

## 2021-02-11 LAB — CBC WITH DIFFERENTIAL/PLATELET
Basophils Absolute: 0 10*3/uL (ref 0.0–0.2)
Basos: 1 %
EOS (ABSOLUTE): 0.1 10*3/uL (ref 0.0–0.4)
Eos: 2 %
Hematocrit: 42.4 % (ref 34.0–46.6)
Hemoglobin: 14.5 g/dL (ref 11.1–15.9)
Immature Grans (Abs): 0 10*3/uL (ref 0.0–0.1)
Immature Granulocytes: 0 %
Lymphocytes Absolute: 1.2 10*3/uL (ref 0.7–3.1)
Lymphs: 18 %
MCH: 32.6 pg (ref 26.6–33.0)
MCHC: 34.2 g/dL (ref 31.5–35.7)
MCV: 95 fL (ref 79–97)
Monocytes Absolute: 0.4 10*3/uL (ref 0.1–0.9)
Monocytes: 7 %
Neutrophils Absolute: 4.9 10*3/uL (ref 1.4–7.0)
Neutrophils: 72 %
Platelets: 203 10*3/uL (ref 150–450)
RBC: 4.45 x10E6/uL (ref 3.77–5.28)
RDW: 11.6 % — ABNORMAL LOW (ref 11.7–15.4)
WBC: 6.7 10*3/uL (ref 3.4–10.8)

## 2021-02-11 LAB — VITAMIN D 25 HYDROXY (VIT D DEFICIENCY, FRACTURES): Vit D, 25-Hydroxy: 30.3 ng/mL (ref 30.0–100.0)

## 2021-02-11 LAB — VITAMIN B1: Thiamine: 175.5 nmol/L (ref 66.5–200.0)

## 2021-02-11 LAB — MAGNESIUM: Magnesium: 2 mg/dL (ref 1.6–2.3)

## 2021-02-11 LAB — VITAMIN E
Vitamin E (Alpha Tocopherol): 19.7 mg/L (ref 7.0–25.1)
Vitamin E(Gamma Tocopherol): 1.1 mg/L (ref 0.5–5.5)

## 2021-02-11 LAB — FERRITIN: Ferritin: 167 ng/mL — ABNORMAL HIGH (ref 15–150)

## 2021-02-11 LAB — PARATHYROID HORMONE, INTACT (NO CA): PTH: 45 pg/mL (ref 15–65)

## 2021-02-11 LAB — ZINC

## 2021-02-11 LAB — H. PYLORI ANTIBODY, IGG: H. pylori, IgG AbS: 0.09 Index Value (ref 0.00–0.79)

## 2021-02-11 LAB — HGB A1C W/O EAG: Hgb A1c MFr Bld: 7.8 % — ABNORMAL HIGH (ref 4.8–5.6)

## 2021-02-11 LAB — VITAMIN B12: Vitamin B-12: 627 pg/mL (ref 232–1245)

## 2021-02-11 LAB — NICOTINE SCREEN, URINE: Cotinine Ql Scrn, Ur: NEGATIVE ng/mL

## 2021-02-11 LAB — ZOLPIDEM, UR
Zolpidem Acid: NEGATIVE ng/mL
Zolpidem: NEGATIVE ng/mL

## 2021-02-11 LAB — TSH: TSH: 5.02 u[IU]/mL — ABNORMAL HIGH (ref 0.450–4.500)

## 2021-02-11 LAB — COPPER, SERUM

## 2021-02-11 LAB — IRON: Iron: 102 ug/dL (ref 27–159)

## 2021-02-11 LAB — VITAMIN A: Vitamin A: 53.2 ug/dL (ref 20.1–62.0)

## 2021-02-11 LAB — FOLATE: Folate: >20 ng/mL (ref >3.0)

## 2021-02-11 LAB — VITAMIN K1, SERUM: VITAMIN K1: 2.18 ng/mL (ref 0.10–2.20)

## 2021-09-02 DIAGNOSIS — R7989 Other specified abnormal findings of blood chemistry: Secondary | ICD-10-CM | POA: Insufficient documentation

## 2022-02-27 ENCOUNTER — Other Ambulatory Visit: Payer: Self-pay | Admitting: Physician Assistant

## 2022-02-27 DIAGNOSIS — Z1231 Encounter for screening mammogram for malignant neoplasm of breast: Secondary | ICD-10-CM

## 2022-02-27 DIAGNOSIS — N649 Disorder of breast, unspecified: Secondary | ICD-10-CM

## 2022-02-27 DIAGNOSIS — N644 Mastodynia: Secondary | ICD-10-CM

## 2022-03-19 ENCOUNTER — Ambulatory Visit
Admission: RE | Admit: 2022-03-19 | Discharge: 2022-03-19 | Disposition: A | Discharge status: Home / Self Care | Payer: Managed Care, Other (non HMO) | Source: Ambulatory Visit | Attending: Physician Assistant | Admitting: Physician Assistant

## 2022-03-19 DIAGNOSIS — N649 Disorder of breast, unspecified: Secondary | ICD-10-CM

## 2022-03-19 DIAGNOSIS — Z1231 Encounter for screening mammogram for malignant neoplasm of breast: Secondary | ICD-10-CM

## 2022-03-19 DIAGNOSIS — N644 Mastodynia: Secondary | ICD-10-CM | POA: Diagnosis present

## 2022-05-22 ENCOUNTER — Other Ambulatory Visit: Payer: Self-pay | Admitting: Physician Assistant

## 2022-05-22 DIAGNOSIS — Z780 Asymptomatic menopausal state: Secondary | ICD-10-CM

## 2022-07-29 ENCOUNTER — Ambulatory Visit
Admission: RE | Admit: 2022-07-29 | Discharge: 2022-07-29 | Disposition: A | Discharge status: Home / Self Care | Payer: Managed Care, Other (non HMO) | Source: Ambulatory Visit | Attending: Physician Assistant | Admitting: Physician Assistant

## 2022-07-29 DIAGNOSIS — Z780 Asymptomatic menopausal state: Secondary | ICD-10-CM | POA: Insufficient documentation

## 2022-10-22 ENCOUNTER — Other Ambulatory Visit: Payer: Self-pay | Admitting: Physician Assistant

## 2022-10-22 DIAGNOSIS — M2560 Stiffness of unspecified joint, not elsewhere classified: Secondary | ICD-10-CM

## 2022-10-23 ENCOUNTER — Ambulatory Visit
Admission: RE | Admit: 2022-10-23 | Discharge: 2022-10-23 | Disposition: A | Discharge status: Home / Self Care | Payer: Managed Care, Other (non HMO) | Source: Ambulatory Visit | Attending: Physician Assistant | Admitting: Physician Assistant

## 2022-10-23 ENCOUNTER — Ambulatory Visit
Admission: RE | Admit: 2022-10-23 | Discharge: 2022-10-23 | Disposition: A | Discharge status: Home / Self Care | Payer: Managed Care, Other (non HMO) | Attending: Physician Assistant | Admitting: Physician Assistant

## 2022-10-23 DIAGNOSIS — M2560 Stiffness of unspecified joint, not elsewhere classified: Secondary | ICD-10-CM | POA: Diagnosis not present

## 2023-01-14 ENCOUNTER — Other Ambulatory Visit: Payer: Self-pay | Admitting: Obstetrics and Gynecology

## 2023-01-14 DIAGNOSIS — Z1231 Encounter for screening mammogram for malignant neoplasm of breast: Secondary | ICD-10-CM

## 2023-03-22 ENCOUNTER — Ambulatory Visit
Admission: RE | Admit: 2023-03-22 | Discharge: 2023-03-22 | Disposition: A | Discharge status: Home / Self Care | Payer: Managed Care, Other (non HMO) | Source: Ambulatory Visit | Attending: Pain Medicine | Admitting: Pain Medicine

## 2023-03-22 ENCOUNTER — Other Ambulatory Visit: Payer: Self-pay | Admitting: Pain Medicine

## 2023-03-22 ENCOUNTER — Ambulatory Visit (HOSPITAL_BASED_OUTPATIENT_CLINIC_OR_DEPARTMENT_OTHER): Payer: Managed Care, Other (non HMO) | Admitting: Pain Medicine

## 2023-03-22 ENCOUNTER — Encounter: Payer: Self-pay | Admitting: Pain Medicine

## 2023-03-22 ENCOUNTER — Ambulatory Visit
Admission: RE | Admit: 2023-03-22 | Discharge: 2023-03-22 | Disposition: A | Discharge status: Home / Self Care | Payer: Managed Care, Other (non HMO) | Source: Ambulatory Visit | Attending: Obstetrics and Gynecology | Admitting: Obstetrics and Gynecology

## 2023-03-22 VITALS — BP 137/69 | HR 84 | Temp 97.2°F | Resp 16 | Ht 68.0 in | Wt 185.0 lb

## 2023-03-22 DIAGNOSIS — G894 Chronic pain syndrome: Secondary | ICD-10-CM | POA: Diagnosis not present

## 2023-03-22 DIAGNOSIS — Z79899 Other long term (current) drug therapy: Secondary | ICD-10-CM | POA: Diagnosis present

## 2023-03-22 DIAGNOSIS — M25551 Pain in right hip: Secondary | ICD-10-CM | POA: Insufficient documentation

## 2023-03-22 DIAGNOSIS — R937 Abnormal findings on diagnostic imaging of other parts of musculoskeletal system: Secondary | ICD-10-CM | POA: Insufficient documentation

## 2023-03-22 DIAGNOSIS — M79604 Pain in right leg: Secondary | ICD-10-CM | POA: Diagnosis present

## 2023-03-22 DIAGNOSIS — Z9889 Other specified postprocedural states: Secondary | ICD-10-CM | POA: Insufficient documentation

## 2023-03-22 DIAGNOSIS — M25511 Pain in right shoulder: Secondary | ICD-10-CM | POA: Diagnosis not present

## 2023-03-22 DIAGNOSIS — M25552 Pain in left hip: Secondary | ICD-10-CM | POA: Diagnosis present

## 2023-03-22 DIAGNOSIS — M542 Cervicalgia: Secondary | ICD-10-CM

## 2023-03-22 DIAGNOSIS — M25512 Pain in left shoulder: Secondary | ICD-10-CM | POA: Diagnosis not present

## 2023-03-22 DIAGNOSIS — G8929 Other chronic pain: Secondary | ICD-10-CM

## 2023-03-22 DIAGNOSIS — M79605 Pain in left leg: Secondary | ICD-10-CM | POA: Insufficient documentation

## 2023-03-22 DIAGNOSIS — M5030 Other cervical disc degeneration, unspecified cervical region: Secondary | ICD-10-CM | POA: Insufficient documentation

## 2023-03-22 DIAGNOSIS — M899 Disorder of bone, unspecified: Secondary | ICD-10-CM | POA: Insufficient documentation

## 2023-03-22 DIAGNOSIS — M545 Low back pain, unspecified: Secondary | ICD-10-CM | POA: Insufficient documentation

## 2023-03-22 DIAGNOSIS — M79601 Pain in right arm: Secondary | ICD-10-CM | POA: Diagnosis present

## 2023-03-22 DIAGNOSIS — Z1231 Encounter for screening mammogram for malignant neoplasm of breast: Secondary | ICD-10-CM | POA: Insufficient documentation

## 2023-03-22 DIAGNOSIS — G8928 Other chronic postprocedural pain: Secondary | ICD-10-CM | POA: Diagnosis present

## 2023-03-22 DIAGNOSIS — M19071 Primary osteoarthritis, right ankle and foot: Secondary | ICD-10-CM | POA: Insufficient documentation

## 2023-03-22 DIAGNOSIS — M4800 Spinal stenosis, site unspecified: Secondary | ICD-10-CM | POA: Insufficient documentation

## 2023-03-22 DIAGNOSIS — M79671 Pain in right foot: Secondary | ICD-10-CM | POA: Diagnosis present

## 2023-03-22 DIAGNOSIS — M79672 Pain in left foot: Secondary | ICD-10-CM | POA: Insufficient documentation

## 2023-03-22 DIAGNOSIS — M797 Fibromyalgia: Secondary | ICD-10-CM | POA: Insufficient documentation

## 2023-03-22 DIAGNOSIS — M79602 Pain in left arm: Secondary | ICD-10-CM | POA: Diagnosis present

## 2023-03-22 DIAGNOSIS — M461 Sacroiliitis, not elsewhere classified: Secondary | ICD-10-CM | POA: Insufficient documentation

## 2023-03-22 DIAGNOSIS — Z789 Other specified health status: Secondary | ICD-10-CM | POA: Diagnosis present

## 2023-03-22 DIAGNOSIS — Z87891 Personal history of nicotine dependence: Secondary | ICD-10-CM | POA: Insufficient documentation

## 2023-03-22 DIAGNOSIS — M2578 Osteophyte, vertebrae: Secondary | ICD-10-CM | POA: Insufficient documentation

## 2023-03-22 NOTE — Progress Notes (Signed)
Safety precautions to be maintained throughout the outpatient stay will include: orient to surroundings, keep bed in low position, maintain call bell within reach at all times, provide assistance with transfer out of bed and ambulation.  

## 2023-03-22 NOTE — Progress Notes (Signed)
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 alternating episodes of diarrhea and constipation, inflamed or scarred liver, or pancreas or irrregular and/or infrequent bowel movements Genitourinary: No reported renal or genitourinary signs or symptoms such as difficulty voiding or producing urine, peeing blood, non-functioning kidney, kidney stones, difficulty emptying the bladder, difficulty controlling the flow of urine, or chronic kidney disease Hematological: Brusing easily Endocrine: High blood sugar controlled without the use of insulin (NIDDM) and Slow thyroid Rheumatologic: Generalized muscle aches (Fibromyalgia) Musculoskeletal: Negative for myasthenia gravis, muscular dystrophy, multiple sclerosis or malignant hyperthermia  Work History: Working full time  Allergies  Brandy Bryant is allergic to adhesive [tape], codeine, oxycodone, penicillins, valium [diazepam], and vancomycin.  Laboratory Chemistry Profile   Renal Lab Results  Component Value Date   BUN 13 01/13/2021   CREATININE  0.81 01/13/2021   BCR 16 01/13/2021   GFRAA >60 09/08/2019   GFRNONAA >60 10/24/2020   SPECGRAV 1.010 12/01/2016   PHUR 5.5 12/01/2016   PROTEINUR NEGATIVE 10/24/2020     Electrolytes Lab Results  Component Value Date   NA 137 01/13/2021   K 4.7 01/13/2021   CL 98 01/13/2021   CALCIUM 9.3 01/13/2021   MG 2.0 01/13/2021     Hepatic Lab Results  Component Value Date   AST 15 01/13/2021   ALT 14 01/13/2021   ALBUMIN 4.1 01/13/2021   ALKPHOS 91 01/13/2021   LIPASE 26 10/24/2020     ID Lab Results  Component Value Date   SARSCOV2NAA Not Detected 07/27/2019   PREGTESTUR NEGATIVE 05/31/2017     Bone Lab Results  Component Value Date   VD25OH 30.3 01/13/2021     Endocrine Lab Results  Component Value Date   GLUCOSE 235 (H) 01/13/2021   GLUCOSEU >=500 (A) 10/24/2020   HGBA1C 7.8 (H) 01/13/2021   TSH 5.020 (H) 01/13/2021   FREET4 1.46 01/19/2018     Neuropathy Lab Results  Component Value Date   VITAMINB12 627 01/13/2021   FOLATE >20.0 01/13/2021   HGBA1C 7.8 (H) 01/13/2021     CNS No results found for: "COLORCSF", "APPEARCSF", "RBCCOUNTCSF", "WBCCSF", "POLYSCSF", "LYMPHSCSF", "EOSCSF", "PROTEINCSF", "GLUCCSF", "JCVIRUS", "CSFOLI", "IGGCSF", "LABACHR", "ACETBL"   Inflammation (CRP: Acute  ESR: Chronic) No results found for: "CRP", "ESRSEDRATE", "LATICACIDVEN"   Rheumatology No results found for: "RF", "ANA", "LABURIC", "URICUR", "LYMEIGGIGMAB", "LYMEABIGMQN", "HLAB27"   Coagulation Lab Results  Component Value Date   PLT 203 01/13/2021   VITAMINK1 2.18 01/13/2021     Cardiovascular Lab Results  Component Value Date   CKTOTAL 72 07/23/2017   CKMB 0 07/23/2017   TROPONINI <0.03 01/19/2015   HGB 14.5 01/13/2021   HCT 42.4 01/13/2021     Screening Lab Results  Component Value Date   SARSCOV2NAA Not Detected 07/27/2019   PREGTESTUR NEGATIVE 05/31/2017     Cancer No results found for: "CEA", "CA125", "LABCA2"   Allergens No results found for:  "ALMOND", "APPLE", "ASPARAGUS", "AVOCADO", "BANANA", "BARLEY", "BASIL", "BAYLEAF", "GREENBEAN", "LIMABEAN", "WHITEBEAN", "BEEFIGE", "REDBEET", "BLUEBERRY", "BROCCOLI", "CABBAGE", "MELON", "CARROT", "CASEIN", "CASHEWNUT", "CAULIFLOWER", "CELERY"     Note: Lab results reviewed.  PFSH  Drug: Brandy Bryant  reports no history of drug use. Alcohol:  reports current alcohol use of about 4.0 standard drinks of alcohol per week. Tobacco:  reports that she has quit smoking. She has never used smokeless tobacco. Medical:  has a past medical history of Chicken pox, Depression, Diabetes mellitus without complication (HCC), Diverticulitis, GERD (gastroesophageal reflux disease), Hyperlipidemia, Hypertension, Sleep apnea, and Thyroid disease. Family: family history includes Hyperlipidemia in her father and mother; Hypertension in her father and mother.  Past Surgical History:  Procedure Laterality Date   ANTERIOR CERVICAL DECOMP/DISCECTOMY FUSION     CERVICAL DISC ARTHROPLASTY  2014   CHOLECYSTECTOMY  2014   CYSTOSCOPY  05/31/2017   Procedure: CYSTOSCOPY;  Surgeon: [name], MD;  Location: [facility];  Service: Gynecology;;   FOOT SURGERY Left    bone spur   LAPAROSCOPIC BILATERAL SALPINGECTOMY Bilateral 05/31/2017   Procedure: LAPAROSCOPIC BILATERAL SALPINGECTOMY;  Surgeon: [name], MD;  Location: [facility];  Service: Gynecology;  Laterality: Bilateral;   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 REPAIR     LAPAROSCOPIC HYSTERECTOMY N/A 05/31/2017   Procedure: HYSTERECTOMY TOTAL LAPAROSCOPIC;  Surgeon: [name], MD;  Location: [facility];  Service: Gynecology;  Laterality: N/A;   NASAL SINUS SURGERY Right    ROTATOR CUFF REPAIR Right 2013   TUBAL LIGATION     Active Ambulatory Problems    Diagnosis Date Noted   Right lumbar radiculopathy 06/06/2014   ADD (attention deficit disorder) 03/26/2014   Depressive disorder 04/03/2011   Esophageal reflux 04/03/2011    Essential (primary) hypertension 04/03/2011   Hyperlipidemia 04/03/2011   Acquired hypothyroidism 04/03/2011   Benign essential hypertension 04/03/2011   Nonallopathic lesion of lumbosacral region 09/10/2014   Nonallopathic lesion of sacral region 09/10/2014   Nonallopathic lesion of thoracic region 09/10/2014   Lateral epicondylitis 10/19/2014   Shoulder bursitis 11/12/2014   Cervical disc disorder with radiculopathy of cervical region 01/22/2015   Pelvic pain in female 04/24/2017   Hyperpiesia 04/03/2011   Anxiety and depression 03/21/2016   Abscess of sigmoid colon due to diverticulitis 04/23/2017   Biomechanical lesion, unspecified 09/10/2014   Chickenpox 04/24/2017   Closed fracture of fifth metacarpal bone of right hand 09/06/2020   Elevated liver function tests 09/02/2021   Former smoker 03/22/2023   History of iron deficiency anemia 04/24/2017   Morbid obesity (HCC) 04/03/2011   Obstructive sleep apnea (adult) (pediatric) 04/03/2011   PCOS (polycystic ovarian syndrome) 06/13/2013   Pure hypercholesterolemia 12/05/2013   Status post bariatric surgery 01/20/2021   Type 1 diabetes mellitus with hyperglycemia (HCC) 10/28/2010   Chronic pain syndrome 03/22/2023   Pharmacologic therapy 03/22/2023   Disorder of skeletal system 03/22/2023   Problems influencing health status 03/22/2023   History of cervical spinal surgery (C5-6 ACDF) 03/22/2023   DDD (degenerative disc disease), cervical 03/22/2023   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) 03/22/2023   Central spinal stenosis (C4-5, C6-7) 03/22/2023   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) 03/22/2023   Osteoarthritis of sacroiliac joints (HCC) (Bilateral) 03/22/2023   Osteoarthritis of feet (Bilateral) 03/22/2023   Abnormal MRI, cervical spine (02/16/2015) 03/22/2023   Abnormal MRI, lumbar spine (05/26/2014) 03/22/2023   Chronic low back pain (1ry area of Pain) (Bilateral) (R>L) w/o sciatica 03/22/2023   Chronic shoulder pain (2ry area  of Pain) (Bilateral) (L>R) 03/22/2023   Chronic neck pain with history of cervical spinal surgery 03/22/2023   Chronic midline posterior neck pain (3ry area of Pain) 03/22/2023   Chronic lower extremity pain (4th area of Pain) (Bilateral) (R=L) 03/22/2023   Chronic hip pain (5th area of Pain) (Bilateral) (R>L) 03/22/2023   Chronic feet pain (6th area of Pain) (Bilateral) (R=L) 03/22/2023   Chronic upper extremity pain (7th area of Pain) (Bilateral) (R>L) 03/22/2023   Fibromyalgia 03/22/2023   Resolved Ambulatory Problems    Diagnosis Date Noted   No Resolved Ambulatory Problems   Past Medical History:  Diagnosis Date   Chicken pox    Depression    Diabetes mellitus without complication (HCC)    Diverticulitis    GERD (gastroesophageal reflux disease)    Hypertension    Sleep apnea    Thyroid disease    Constitutional Exam  General appearance: Well nourished, well developed, and well hydrated. In no apparent acute distress Vitals:   03/22/23 1258  BP: 137/69  Pulse: 84  Resp: 16  Temp: (!) 97.2 F (36.2 C)  SpO2: 100%  Weight: 185 lb (83.9 kg)  Height: 5\' 8"  (  1.727 m)   BMI Assessment: Estimated body mass index is 28.13 kg/m as calculated from the following:   Height as of this encounter: 5\' 8"  (1.727 m).   Weight as of this encounter: 185 lb (83.9 kg).  BMI interpretation table: BMI level Category Range association with higher incidence of chronic pain  <18 kg/m2 Underweight   18.5-24.9 kg/m2 Ideal body weight   25-29.9 kg/m2 Overweight Increased incidence by 20%  30-34.9 kg/m2 Obese (Class I) Increased incidence by 68%  35-39.9 kg/m2 Severe obesity (Class II) Increased incidence by 136%  >40 kg/m2 Extreme obesity (Class III) Increased incidence by 254%   Patient's current BMI Ideal Body weight  Body mass index is 28.13 kg/m. Ideal body weight: 63.9 kg (140 lb 14 oz) Adjusted ideal body weight: 71.9 kg (158 lb 8.4 oz)   BMI Readings from Last 4 Encounters:   03/22/23 28.13 kg/m  10/24/20 36.19 kg/m  05/06/20 35.73 kg/m  09/08/19 37.25 kg/m   Wt Readings from Last 4 Encounters:  03/22/23 185 lb (83.9 kg)  10/24/20 238 lb (108 kg)  05/06/20 235 lb (106.6 kg)  09/08/19 245 lb (111.1 kg)    Psych/Mental status: Alert, oriented x 3 (person, place, & time)       Eyes: PERLA Respiratory: No evidence of acute respiratory distress  Assessment  Primary Diagnosis & Pertinent Problem List: The primary encounter diagnosis was Chronic pain syndrome. Diagnoses of Chronic low back pain (1ry area of Pain) (Bilateral) (R>L) w/o sciatica, Chronic shoulder pain (2ry area of Pain) (Bilateral) (L>R), Chronic neck pain with history of cervical spinal surgery, Chronic midline posterior neck pain (3ry area of Pain), Chronic lower extremity pain (4th area of Pain) (Bilateral) (R=L), Chronic hip pain (5th area of Pain) (Bilateral) (R>L), Chronic feet pain (6th area of Pain) (Bilateral) (R=L), Chronic upper extremity pain (7th area of Pain) (Bilateral) (R>L), Fibromyalgia, Abnormal MRI, cervical spine (02/16/2015), Pharmacologic therapy, Abnormal MRI, lumbar spine (05/26/2014), Disorder of skeletal system, and Problems influencing health status were also pertinent to this visit.  Visit Diagnosis (New problems to examiner): 1. Chronic pain syndrome   2. Chronic low back pain (1ry area of Pain) (Bilateral) (R>L) w/o sciatica   3. Chronic shoulder pain (2ry area of Pain) (Bilateral) (L>R)   4. Chronic neck pain with history of cervical spinal surgery   5. Chronic midline posterior neck pain (3ry area of Pain)   6. Chronic lower extremity pain (4th area of Pain) (Bilateral) (R=L)   7. Chronic hip pain (5th area of Pain) (Bilateral) (R>L)   8. Chronic feet pain (6th area of Pain) (Bilateral) (R=L)   9. Chronic upper extremity pain (7th area of Pain) (Bilateral) (R>L)   10. Fibromyalgia   11. Abnormal MRI, cervical spine (02/16/2015)   12. Pharmacologic therapy    13. Abnormal MRI, lumbar spine (05/26/2014)   14. Disorder of skeletal system   15. Problems influencing health status    Plan of Care (Initial workup plan)  Note: Brandy Bryant was reminded that as per protocol, today's visit has been an evaluation only. We have not taken over the patient's controlled substance management.  Problem-specific plan: No problem-specific Assessment & Plan notes found for this encounter.  Lab Orders         Compliance Drug Analysis, Ur         Comp. Metabolic Panel (12)         Magnesium         Vitamin B12  Sedimentation rate         25-Hydroxy vitamin D Lcms D2+D3         C-reactive protein     Imaging Orders         DG Cervical Spine With Flex & Extend         DG Lumbar Spine Complete W/Bend         DG Shoulder Right         DG Shoulder Left         DG HIP UNILAT W OR W/O PELVIS 2-3 VIEWS RIGHT         DG Foot Complete Right         DG Foot Complete Left     Referral Orders  No referral(s) requested today   Procedure Orders    No procedure(s) ordered today   Pharmacotherapy (current): Medications ordered:  No orders of the defined types were placed in this encounter.  Medications administered during this visit: Brandy Pignatelli. Bryant had no medications administered during this visit.   Analgesic Pharmacotherapy:  Opioid Analgesics: For patients currently taking or requesting to take opioid analgesics, in accordance with [facility] Guidelines, we will assess their risks and indications for the use of these substances. After completing our evaluation, we may offer recommendations, but we no longer take patients for medication management. The prescribing physician will ultimately decide, based on his/her training and level of comfort whether to adopt any of the recommendations, including whether or not to prescribe such medicines.  Membrane stabilizer: To be determined at a later time  Muscle relaxant: To be determined at a later  time  NSAID: To be determined at a later time  Other analgesic(s): To be determined at a later time   Interventional management options: Brandy Bryant was informed that there is no guarantee that she would be a candidate for interventional therapies. The decision will be based on the results of diagnostic studies, as well as Brandy Bryant risk profile.  Procedure(s) under consideration:  Pending results of ordered studies      Interventional Therapies  Risk Factors  Considerations  Medical Comorbidities:     Planned  Pending:      Under consideration:   Pending   Completed:   None at this time   Therapeutic  Palliative (PRN) options:   None established   Completed by other providers:   None reported       Provider-requested follow-up: Return in about 2 weeks (around 04/05/2023) for ( ), Eval-day (M,W), (F2F), 2nd Visit, for review of ordered tests.  Future Appointments  Date Time Provider Department Center  04/07/2023  8:00 AM Delano Metz, MD [facility] None     Duration of encounter: 99 minutes.  Total time on encounter, as per AMA guidelines included both the face-to-face and non-face-to-face time personally spent by the physician and/or other qualified health care professional(s) on the day of the encounter (includes time in activities that require the physician or other qualified health care professional and does not include time in activities normally performed by clinical staff). Physician's time may include the following activities when performed: Preparing to see the patient (e.g., pre-charting review of records, searching for previously ordered imaging, lab work, and nerve conduction tests) Review of prior analgesic pharmacotherapies. Reviewing PMP Interpreting ordered tests (e.g., lab work, imaging, nerve conduction tests) Performing post-procedure evaluations, including interpretation of diagnostic procedures Obtaining and/or reviewing separately  obtained history Performing a medically appropriate examination and/or evaluation Counseling  and educating the patient/family/caregiver Ordering medications, tests, or procedures Referring and communicating with other health care professionals (when not separately reported) Documenting clinical information in the electronic or other health record Independently interpreting results (not separately reported) and communicating results to the patient/ family/caregiver Care coordination (not separately reported)  Note by: Oswaldo Done, MD (TTS technology used. I apologize for any typographical errors that were not detected and corrected.) Date: 03/22/2023; Time: 2:35 PM

## 2023-03-22 NOTE — Patient Instructions (Signed)

## 2023-04-06 NOTE — Progress Notes (Deleted)
(  04/07/2023) the patient called on 04/06/2023 to cancel her follow-up visit to her initial evaluation.  She was given the alternative of rescheduling but she indicated that she was not interested.

## 2023-04-07 ENCOUNTER — Ambulatory Visit: Payer: Managed Care, Other (non HMO) | Admitting: Pain Medicine

## 2023-06-18 ENCOUNTER — Other Ambulatory Visit: Payer: Self-pay | Admitting: Medical Genetics

## 2023-06-18 DIAGNOSIS — Z006 Encounter for examination for normal comparison and control in clinical research program: Secondary | ICD-10-CM

## 2023-06-22 ENCOUNTER — Other Ambulatory Visit: Payer: Self-pay | Admitting: Gastroenterology

## 2023-06-22 DIAGNOSIS — R748 Abnormal levels of other serum enzymes: Secondary | ICD-10-CM

## 2023-06-30 ENCOUNTER — Ambulatory Visit
Admission: RE | Admit: 2023-06-30 | Discharge: 2023-06-30 | Disposition: A | Discharge status: Home / Self Care | Payer: Managed Care, Other (non HMO) | Source: Ambulatory Visit | Attending: Gastroenterology | Admitting: Gastroenterology

## 2023-06-30 DIAGNOSIS — R748 Abnormal levels of other serum enzymes: Secondary | ICD-10-CM | POA: Insufficient documentation

## 2023-07-07 ENCOUNTER — Other Ambulatory Visit: Payer: Managed Care, Other (non HMO) | Attending: Medical Genetics

## 2023-07-16 ENCOUNTER — Encounter: Payer: Self-pay | Admitting: *Deleted

## 2023-08-06 ENCOUNTER — Encounter: Payer: Self-pay | Admitting: *Deleted

## 2023-08-06 ENCOUNTER — Encounter
Admission: RE | Disposition: A | Discharge status: Home / Self Care | Payer: Self-pay | Source: Home / Self Care | Attending: Gastroenterology

## 2023-08-06 ENCOUNTER — Ambulatory Visit: Payer: Managed Care, Other (non HMO) | Admitting: Anesthesiology

## 2023-08-06 ENCOUNTER — Ambulatory Visit
Admission: RE | Admit: 2023-08-06 | Discharge: 2023-08-06 | Disposition: A | Discharge status: Home / Self Care | Payer: Managed Care, Other (non HMO) | Attending: Gastroenterology | Admitting: Gastroenterology

## 2023-08-06 DIAGNOSIS — Z87891 Personal history of nicotine dependence: Secondary | ICD-10-CM | POA: Diagnosis not present

## 2023-08-06 DIAGNOSIS — K219 Gastro-esophageal reflux disease without esophagitis: Secondary | ICD-10-CM | POA: Diagnosis not present

## 2023-08-06 DIAGNOSIS — Z1211 Encounter for screening for malignant neoplasm of colon: Secondary | ICD-10-CM | POA: Diagnosis present

## 2023-08-06 DIAGNOSIS — Z9049 Acquired absence of other specified parts of digestive tract: Secondary | ICD-10-CM | POA: Diagnosis not present

## 2023-08-06 DIAGNOSIS — D128 Benign neoplasm of rectum: Secondary | ICD-10-CM | POA: Diagnosis not present

## 2023-08-06 DIAGNOSIS — Z79899 Other long term (current) drug therapy: Secondary | ICD-10-CM | POA: Insufficient documentation

## 2023-08-06 DIAGNOSIS — E039 Hypothyroidism, unspecified: Secondary | ICD-10-CM | POA: Diagnosis not present

## 2023-08-06 DIAGNOSIS — Z83719 Family history of colon polyps, unspecified: Secondary | ICD-10-CM | POA: Diagnosis not present

## 2023-08-06 DIAGNOSIS — G4730 Sleep apnea, unspecified: Secondary | ICD-10-CM | POA: Diagnosis not present

## 2023-08-06 DIAGNOSIS — Z794 Long term (current) use of insulin: Secondary | ICD-10-CM | POA: Insufficient documentation

## 2023-08-06 DIAGNOSIS — K640 First degree hemorrhoids: Secondary | ICD-10-CM | POA: Diagnosis not present

## 2023-08-06 DIAGNOSIS — E785 Hyperlipidemia, unspecified: Secondary | ICD-10-CM | POA: Diagnosis not present

## 2023-08-06 DIAGNOSIS — Z90710 Acquired absence of both cervix and uterus: Secondary | ICD-10-CM | POA: Diagnosis not present

## 2023-08-06 DIAGNOSIS — Z7984 Long term (current) use of oral hypoglycemic drugs: Secondary | ICD-10-CM | POA: Diagnosis not present

## 2023-08-06 DIAGNOSIS — I10 Essential (primary) hypertension: Secondary | ICD-10-CM | POA: Diagnosis not present

## 2023-08-06 DIAGNOSIS — E109 Type 1 diabetes mellitus without complications: Secondary | ICD-10-CM | POA: Insufficient documentation

## 2023-08-06 HISTORY — PX: COLONOSCOPY WITH PROPOFOL: SHX5780

## 2023-08-06 LAB — GLUCOSE, CAPILLARY: Glucose-Capillary: 84 mg/dL (ref 70–99)

## 2023-08-06 SURGERY — COLONOSCOPY WITH PROPOFOL
Anesthesia: General

## 2023-08-06 MED ORDER — PROPOFOL 10 MG/ML IV BOLUS
INTRAVENOUS | Status: DC | PRN
  Administered 2023-08-06: 140 ug/kg/min via INTRAVENOUS
  Administered 2023-08-06: 100 mg via INTRAVENOUS
  Administered 2023-08-06: 50 mg via INTRAVENOUS
  Administered 2023-08-06: 140 ug/kg/min via INTRAVENOUS

## 2023-08-06 MED ORDER — LIDOCAINE HCL (CARDIAC) PF 100 MG/5ML IV SOSY
PREFILLED_SYRINGE | INTRAVENOUS | Status: DC | PRN
  Administered 2023-08-06: 100 mg via INTRAVENOUS

## 2023-08-06 MED ORDER — SODIUM CHLORIDE 0.9 % IV SOLN: INTRAVENOUS | Status: DC

## 2023-08-06 NOTE — Op Note (Signed)
[facility] Gastroenterology Patient Name: Brandy Bryant Procedure Date: 08/06/2023 1:20 PM MRN: [id number] Account #: [id number] Date of Birth: 1971-02-19 Admit Type: Outpatient Age: 52 Room: [facility] ENDO ROOM 3 Gender: Female Note Status: Finalized Instrument Name: Prentice Docker [id number] Procedure:             Colonoscopy Indications:           Colon cancer screening in patient at increased risk:                         Family history of 1st-degree relative with colon                         polyps, Colon cancer screening in patient at increased                         risk: Family history of colon polyps in multiple                         1st-degree relatives Providers:             Eather Colas MD, MD Referring MD:          Marylin Crosby. Greggory Stallion MD, MD (Referring MD) Medicines:             Monitored Anesthesia Care Complications:         No immediate complications. Estimated blood loss:                         Minimal. Procedure:             Pre-Anesthesia Assessment:                        - Prior to the procedure, a History and Physical was                         performed, and patient medications and allergies were                         reviewed. The patient is competent. The risks and                         benefits of the procedure and the sedation options and                         risks were discussed with the patient. All questions                         were answered and informed consent was obtained.                         Patient identification and proposed procedure were                         verified by the physician, the nurse, the                         anesthesiologist, the anesthetist and the technician  in the endoscopy suite. Mental Status Examination:                         alert and oriented. Airway Examination: normal                         oropharyngeal airway and neck mobility. Respiratory                          Examination: clear to auscultation. CV Examination:                         normal. Prophylactic Antibiotics: The patient does not                         require prophylactic antibiotics. Prior                         Anticoagulants: The patient has taken no anticoagulant                         or antiplatelet agents. ASA Grade Assessment: II - A                         patient with mild systemic disease. After reviewing                         the risks and benefits, the patient was deemed in                         satisfactory condition to undergo the procedure. The                         anesthesia plan was to use monitored anesthesia care                         (MAC). Immediately prior to administration of                         medications, the patient was re-assessed for adequacy                         to receive sedatives. The heart rate, respiratory                         rate, oxygen saturations, blood pressure, adequacy of                         pulmonary ventilation, and response to care were                         monitored throughout the procedure. The physical                         status of the patient was re-assessed after the                         procedure.  After obtaining informed consent, the colonoscope was                         passed under direct vision. Throughout the procedure,                         the patient's blood pressure, pulse, and oxygen                         saturations were monitored continuously. The                         Colonoscope was introduced through the anus and                         advanced to the the cecum, identified by appendiceal                         orifice and ileocecal valve. The colonoscopy was                         performed without difficulty. The patient tolerated                         the procedure well. The quality of the bowel                         preparation was adequate  to identify polyps. The                         ileocecal valve, appendiceal orifice, and rectum were                         photographed. Findings:      The perianal and digital rectal examinations were normal.      A 2 mm polyp was found in the rectum. The polyp was sessile. The polyp       was removed with a cold snare. Resection and retrieval were complete.       Estimated blood loss was minimal.      Internal hemorrhoids were found during retroflexion. The hemorrhoids       were Grade I (internal hemorrhoids that do not prolapse).      The exam was otherwise without abnormality on direct and retroflexion       views. Impression:            - One 2 mm polyp in the rectum, removed with a cold                         snare. Resected and retrieved.                        - Internal hemorrhoids.                        - The examination was otherwise normal on direct and                         retroflexion views. Recommendation:        - Discharge  patient to home.                        - Resume previous diet.                        - Continue present medications.                        - Await pathology results.                        - Repeat colonoscopy date to be determined after                         pending pathology results are reviewed for                         surveillance.                        - Return to referring physician as previously                         scheduled. Procedure Code(s):     --- Professional ---                        [id number], Colonoscopy, flexible; with removal of                         tumor(s), polyp(s), or other lesion(s) by snare                         technique Diagnosis Code(s):     --- Professional ---                        D12.8, Benign neoplasm of rectum                        K64.0, First degree hemorrhoids                        Z83.71, Family history of colonic polyps CPT copyright 2022 [employer]. All rights  reserved. The codes documented in this report are preliminary and upon coder review may  be revised to meet current compliance requirements. Eather Colas MD, MD 08/06/2023 2:00:42 PM Number of Addenda: 0 Note Initiated On: 08/06/2023 1:20 PM Scope Withdrawal Time: 0 hours 15 minutes 0 seconds  Total Procedure Duration: 0 hours 22 minutes 15 seconds  Estimated Blood Loss:  Estimated blood loss was minimal.      [facility]

## 2023-08-06 NOTE — H&P (Signed)
Outpatient short stay form Pre-procedure 08/06/2023  Regis Bill, MD  Primary Physician: Rayetta Humphrey, MD  Reason for visit:  Screening  History of present illness:    52 y/o lady with history of hypertension, hypothyroidism, and HLD here for screening colonoscopy. Last colonoscopy was 5 years ago and was normal. Parents might have had polyps but no first degree relatives with GI malignancies. History of duodenal switch, cholecystectomy, and hysterectomy. No blood thinners.    Current Facility-Administered Medications:    0.9 %  sodium chloride infusion, , Intravenous, Continuous, Mozelle Remlinger, Rossie Muskrat, MD, Last Rate: 20 mL/hr at 08/06/23 1243, New Bag at 08/06/23 1243  Medications Prior to Admission  Medication Sig Dispense Refill Last Dose/Taking   B Complex-Folic Acid (B COMPLEX VITAMINS, W/ FA, PO)       BAQSIMI ONE PACK 3 MG/DOSE POWD Use as directed for severe hypoglycemia      Biotin 10 MG CAPS 1 capsule every day by oral route.      Biotin 1000 MCG CHEW Chew 1 each by mouth daily.      buPROPion (WELLBUTRIN XL) 150 MG 24 hr tablet       buPROPion (WELLBUTRIN XL) 300 MG 24 hr tablet Take 300 mg by mouth daily.       buPROPion (WELLBUTRIN XL) 300 MG 24 hr tablet 1 tablet in the morning Oral Once a day for 90 days      buPROPion (WELLBUTRIN) 75 MG tablet Take by mouth.      Calcium Carb-Cholecalciferol 500-10 MG-MCG TABS Take 1 tablet by mouth daily.      calcium carbonate 100 mg/ml SUSP Take by mouth.      Dextroamphetamine Sulfate 20 MG TABS       Dextroamphetamine Sulfate 20 MG TABS Take by mouth.      diclofenac Sodium (PENNSAID) 2 % SOLN Pennsaid 20 mg/gram/actuation (2 %) topical soln in metered-dose pump  apply ONE pump TWICE DAILY AS DIRECTED      dicyclomine (BENTYL) 10 MG capsule       estradiol (ESTRACE) 0.1 MG/GM vaginal cream Insert fingertip unit nightly x 2 weeks then every other night x 2 weeks then 2-3 times weekly for maintenace      estradiol  (ESTRACE) 0.1 MG/GM vaginal cream Place vaginally.      estradiol (ESTRACE) 1 MG tablet Take by mouth.      estradiol (ESTRACE) 2 MG tablet Take 2 mg by mouth daily.      estradiol (ESTRACE) 2 MG tablet Take 1 tablet by mouth daily.      gabapentin (NEURONTIN) 100 MG capsule Take between 300mg  and 600mg  of gabapentin once nightly, as needed.      gabapentin (NEURONTIN) 800 MG tablet Take by mouth.      glucose blood (BAYER CONTOUR NEXT TEST) test strip use 8x/day; dx = 250.03; needs the  Contour next strips that link with her meter      glucose blood (ONETOUCH ULTRA TEST) test strip by Other route five (5) times a day. Check BG 5x daily. Ok to dispense whichever brand patient prefers or insurance covers. E10.65      glucose blood test strip OneTouch Ultra Test strips      glucose blood test strip       ibuprofen (ADVIL,MOTRIN) 800 MG tablet Take 1 tablet (800 mg total) by mouth every 8 (eight) hours as needed for moderate pain. 30 tablet 1    Incontinence Supplies (BARD PROTECTIVE BARRIER FILM) MISC  Use when changing insulin pump sites. All Kare barrier wipes is what she prefers      Insulin Disposable Pump (OMNIPOD 5 G6 PODS, GEN 5,) MISC Inject into the skin.      insulin lispro (HUMALOG) 100 UNIT/ML injection use in pump up to 100 units daily      insulin lispro (HUMALOG) 100 UNIT/ML injection Use up to 100 units/day, in insulin pump as per MD instructions;      levothyroxine (SYNTHROID) 175 MCG tablet 1 tablet in the morning on an empty stomach Orally Once a day      levothyroxine (SYNTHROID) 75 MCG tablet Take by mouth.      levothyroxine (SYNTHROID, LEVOTHROID) 175 MCG tablet Take 175 mcg by mouth See admin instructions. one po daily on 6 days a week, and on the 7th day half tablet      lisdexamfetamine (VYVANSE) 20 MG capsule Take by mouth.      lisdexamfetamine (VYVANSE) 40 MG capsule Take by mouth.      lisdexamfetamine (VYVANSE) 50 MG capsule 1 capsule in the morning Orally Once a day       lisdexamfetamine (VYVANSE) 50 MG capsule Take by mouth.      losartan (COZAAR) 25 MG tablet Take 1 tablet by mouth daily.      metFORMIN (GLUCOPHAGE) 1000 MG tablet 1 tablet every day by oral route.      metFORMIN (GLUCOPHAGE) 500 MG tablet       methylphenidate (RITALIN) 10 MG tablet 1 tablet on empty stomach Oral once per day for 90 days      methylphenidate (RITALIN) 10 MG tablet Take 10 mg by mouth daily.      methylphenidate (RITALIN) 5 MG tablet Take 5 mg by mouth 2 (two) times daily.      methylphenidate (RITALIN) 5 MG tablet Take by mouth.      minoxidil (LONITEN) 2.5 MG tablet Take by mouth daily.      minoxidil (LONITEN) 2.5 MG tablet       Multiple Vitamin (MULTI VITAMIN DAILY PO) 1 tablet every day by oral route.      Multiple Vitamin (MULTI VITAMIN DAILY PO)       Multiple Vitamin (MULTI-VITAMINS) TABS Take 1 tablet by mouth daily.       Multiple Vitamins-Minerals (MULTI COMPLETE) CAPS as directed Orally      mupirocin ointment (BACTROBAN) 2 % 1 application Externally 3 times a day for 5 days      naltrexone (DEPADE) 50 MG tablet Take by mouth.      ondansetron (ZOFRAN ODT) 4 MG disintegrating tablet Take 1 tablet (4 mg total) by mouth every 8 (eight) hours as needed for nausea or vomiting. 20 tablet 0    pantoprazole (PROTONIX) 40 MG tablet Take by mouth.      pantoprazole (PROTONIX) 40 MG tablet       PARoxetine (PAXIL) 30 MG tablet 1 tablet every day by oral route.      Phentermine-Topiramate (QSYMIA) 15-92 MG CP24 Take 1 capsule by mouth daily.      pravastatin (PRAVACHOL) 20 MG tablet Take by mouth.      pravastatin (PRAVACHOL) 40 MG tablet 1 tablet every day by oral route.      pravastatin (PRAVACHOL) 80 MG tablet Take 1 tablet by mouth daily.      sertraline (ZOLOFT) 100 MG tablet Take 150 mg by mouth daily.      sertraline (ZOLOFT) 100 MG tablet 1.5 tablet Oral Once a  day for 90 days      sertraline (ZOLOFT) 25 MG tablet Take by mouth.      sertraline (ZOLOFT) 50  MG tablet Take by mouth.      spironolactone (ALDACTONE) 50 MG tablet Take 50 mg by mouth daily.      spironolactone (ALDACTONE) 50 MG tablet Take by mouth.      testosterone cypionate (DEPOTESTOSTERONE CYPIONATE) 200 MG/ML injection Bring to office monthly for IM injection      topiramate (TOPAMAX) 25 MG tablet       tretinoin (RETIN-A) 0.05 % cream       valACYclovir (VALTREX) 500 MG tablet Take 500 mg by mouth daily.      valACYclovir (VALTREX) 500 MG tablet Take 1 tablet by mouth daily.      valsartan (DIOVAN) 80 MG tablet 1 tablet every day by oral route.        Allergies  Allergen Reactions   Adhesive [Tape] Rash   Codeine Rash   Oxycodone Rash    Other reaction(s): RASH   Penicillins Rash   Valium [Diazepam] Rash   Vancomycin Rash     Past Medical History:  Diagnosis Date   Chicken pox    Depression    Diabetes mellitus without complication (HCC)    Diverticulitis    GERD (gastroesophageal reflux disease)    Hyperlipidemia    Hypertension    Sleep apnea    Thyroid disease     Review of systems:  Otherwise negative.    Physical Exam  Gen: Alert, oriented. Appears stated age.  HEENT: PERRLA. Lungs: No respiratory distress CV: RRR Abd: soft, benign, no masses Ext: No edema    Planned procedures: Proceed with colonoscopy. The patient understands the nature of the planned procedure, indications, risks, alternatives and potential complications including but not limited to bleeding, infection, perforation, damage to internal organs and possible oversedation/side effects from anesthesia. The patient agrees and gives consent to proceed.  Please refer to procedure notes for findings, recommendations and patient disposition/instructions.     Regis Bill, MD [facility]

## 2023-08-06 NOTE — Anesthesia Preprocedure Evaluation (Signed)
Anesthesia Evaluation  Patient identified by MRN, date of birth, ID band Patient awake    Reviewed: Allergy & Precautions, NPO status , Patient's Chart, lab work & pertinent test results  History of Anesthesia Complications Negative for: history of anesthetic complications  Airway Mallampati: III  TM Distance: >3 FB Neck ROM: full    Dental  (+) Chipped   Pulmonary neg pulmonary ROS, sleep apnea and Continuous Positive Airway Pressure Ventilation , neg COPD, former smoker   Pulmonary exam normal        Cardiovascular hypertension, Pt. on medications + Peripheral Vascular Disease  (-) Past MI and (-) CHF negative cardio ROS Normal cardiovascular exam(-) dysrhythmias (-) Valvular Problems/Murmurs     Neuro/Psych  PSYCHIATRIC DISORDERS Anxiety Depression    negative neurological ROS  negative psych ROS   GI/Hepatic negative GI ROS, Neg liver ROS,GERD  Medicated,,  Endo/Other  negative endocrine ROSdiabetes, Type 1, Insulin DependentHypothyroidism    Renal/GU negative Renal ROS  negative genitourinary   Musculoskeletal   Abdominal   Peds  Hematology negative hematology ROS (+)   Anesthesia Other Findings Past Medical History: No date: Chicken pox No date: Depression No date: Diabetes mellitus without complication (HCC) No date: Diverticulitis No date: GERD (gastroesophageal reflux disease) No date: Hyperlipidemia No date: Hypertension No date: Sleep apnea No date: Thyroid disease  Past Surgical History: No date: ANTERIOR CERVICAL DECOMP/DISCECTOMY FUSION 2014: CERVICAL DISC ARTHROPLASTY 2014: CHOLECYSTECTOMY 05/31/2017: CYSTOSCOPY     Comment:  Procedure: CYSTOSCOPY;  Surgeon: [name], MD;                Location: [facility] ORS;  Service: Gynecology;; No date: FOOT SURGERY; Left     Comment:  bone spur 05/31/2017: LAPAROSCOPIC BILATERAL SALPINGECTOMY; Bilateral     Comment:  Procedure: LAPAROSCOPIC  BILATERAL SALPINGECTOMY;                Surgeon: [name], MD;  Location: [facility] OR[facility];                Service: Gynecology;  Laterality: Bilateral; No date: 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  REPAIR 05/31/2017: LAPAROSCOPIC HYSTERECTOMY; N/A     Comment:  Procedure: HYSTERECTOMY TOTAL LAPAROSCOPIC;  Surgeon:               [name], MD;  Location: [facility] ORS;  Service:               Gynecology;  Laterality: N/A; No date: NASAL SINUS SURGERY; Right 2013: ROTATOR CUFF REPAIR; Right No date: TUBAL LIGATION  BMI    Body Mass Index: 28.43 kg/m      Reproductive/Obstetrics negative OB ROS                             Anesthesia Physical Anesthesia Plan  ASA: 2  Anesthesia Plan: General   Post-op Pain Management: Minimal or no pain anticipated   Induction: Intravenous  PONV Risk Score and Plan: 3 and Propofol infusion, TIVA and Ondansetron  Airway Management Planned: Nasal Cannula  Additional Equipment: None  Intra-op Plan:   Post-operative Plan:   Informed Consent: I have reviewed the patients History and Physical, chart, labs and discussed the procedure including the risks, benefits and alternatives for the proposed anesthesia with the patient or authorized representative who has indicated his/her understanding and acceptance.     Dental advisory given  Plan Discussed with: CRNA and Surgeon  Anesthesia Plan Comments: (Discussed risks  of anesthesia with patient, including possibility of difficulty with spontaneous ventilation under anesthesia necessitating airway intervention, PONV, and rare risks such as cardiac or respiratory or neurological events, and allergic reactions. Discussed the role of CRNA in patient's perioperative care. Patient understands.)       Anesthesia Quick Evaluation

## 2023-08-06 NOTE — Transfer of Care (Signed)
Immediate Anesthesia Transfer of Care Note  Patient: Brandy Bryant  Procedure(s) Performed: COLONOSCOPY WITH PROPOFOL  Patient Location: PACU  Anesthesia Type:MAC  Level of Consciousness: drowsy  Airway & Oxygen Therapy: Patient Spontanous Breathing and Patient connected to nasal cannula oxygen  Post-op Assessment: Report given to RN and Post -op Vital signs reviewed and stable  Post vital signs: stable  Last Vitals:  Vitals Value Taken Time  BP 101/60 08/06/23 1400  Temp 35.7 C 08/06/23 1400  Pulse 75 08/06/23 1400  Resp 15 08/06/23 1400  SpO2 100 % 08/06/23 1400  Vitals shown include unfiled device data.  Last Pain:  Vitals:   08/06/23 1400  TempSrc: Temporal  PainSc: 0-No pain         Complications: No notable events documented.

## 2023-08-06 NOTE — Interval H&P Note (Signed)
History and Physical Interval Note:  08/06/2023 1:27 PM  Brandy Bryant  has presented today for surgery, with the diagnosis of family hx of polyps in the colon.  The various methods of treatment have been discussed with the patient and family. After consideration of risks, benefits and other options for treatment, the patient has consented to  Procedure(s): COLONOSCOPY WITH PROPOFOL (N/A) as a surgical intervention.  The patient's history has been reviewed, patient examined, no change in status, stable for surgery.  I have reviewed the patient's chart and labs.  Questions were answered to the patient's satisfaction.     Regis Bill  Ok to proceed with colonoscopy

## 2023-08-09 ENCOUNTER — Encounter: Payer: Self-pay | Admitting: Gastroenterology

## 2023-08-09 LAB — SURGICAL PATHOLOGY

## 2023-08-09 NOTE — Anesthesia Postprocedure Evaluation (Signed)
Anesthesia Post Note  Patient: Shantavia Hartl  Procedure(s) Performed: COLONOSCOPY WITH PROPOFOL  Patient location during evaluation: PACU Anesthesia Type: General Level of consciousness: awake and alert Pain management: pain level controlled Vital Signs Assessment: post-procedure vital signs reviewed and stable Respiratory status: spontaneous breathing, nonlabored ventilation, respiratory function stable and patient connected to nasal cannula oxygen Cardiovascular status: blood pressure returned to baseline and stable Postop Assessment: no apparent nausea or vomiting Anesthetic complications: no   No notable events documented.   Last Vitals:  Vitals:   08/06/23 1400 08/06/23 1410  BP: 101/60 95/71  Pulse:    Resp: 15   Temp: (!) 35.7 C   SpO2:      Last Pain:  Vitals:   08/06/23 1419  TempSrc:   PainSc: 0-No pain                 Stephanie Coup

## 2024-04-21 ENCOUNTER — Other Ambulatory Visit: Payer: Self-pay | Admitting: Obstetrics and Gynecology

## 2024-04-21 DIAGNOSIS — Z1231 Encounter for screening mammogram for malignant neoplasm of breast: Secondary | ICD-10-CM

## 2024-05-09 ENCOUNTER — Ambulatory Visit
Admission: RE | Admit: 2024-05-09 | Discharge: 2024-05-09 | Disposition: A | Discharge status: Home / Self Care | Source: Ambulatory Visit | Attending: Obstetrics and Gynecology | Admitting: Obstetrics and Gynecology

## 2024-05-09 DIAGNOSIS — Z1231 Encounter for screening mammogram for malignant neoplasm of breast: Secondary | ICD-10-CM | POA: Insufficient documentation

## 2024-06-14 ENCOUNTER — Other Ambulatory Visit: Payer: Self-pay | Admitting: Medical Genetics

## 2024-06-14 DIAGNOSIS — Z006 Encounter for examination for normal comparison and control in clinical research program: Secondary | ICD-10-CM
